# Patient Record
Sex: Male | Born: 1945 | Race: White | Hispanic: No | Smoking: Former smoker
Health system: Southern US, Community
[De-identification: ages and names within clinical notes are randomized; demographics above are authoritative.]

## PROBLEM LIST (undated history)

## (undated) DIAGNOSIS — Z8709 Personal history of other diseases of the respiratory system: Secondary | ICD-10-CM

## (undated) DIAGNOSIS — Z9989 Dependence on other enabling machines and devices: Secondary | ICD-10-CM

## (undated) DIAGNOSIS — F32A Depression, unspecified: Secondary | ICD-10-CM

## (undated) DIAGNOSIS — E7800 Pure hypercholesterolemia, unspecified: Secondary | ICD-10-CM

## (undated) DIAGNOSIS — F329 Major depressive disorder, single episode, unspecified: Secondary | ICD-10-CM

## (undated) DIAGNOSIS — H9191 Unspecified hearing loss, right ear: Secondary | ICD-10-CM

## (undated) DIAGNOSIS — M1990 Unspecified osteoarthritis, unspecified site: Secondary | ICD-10-CM

## (undated) DIAGNOSIS — G4733 Obstructive sleep apnea (adult) (pediatric): Secondary | ICD-10-CM

## (undated) DIAGNOSIS — C801 Malignant (primary) neoplasm, unspecified: Secondary | ICD-10-CM

## (undated) DIAGNOSIS — K219 Gastro-esophageal reflux disease without esophagitis: Secondary | ICD-10-CM

## (undated) DIAGNOSIS — I10 Essential (primary) hypertension: Secondary | ICD-10-CM

## (undated) DIAGNOSIS — F419 Anxiety disorder, unspecified: Secondary | ICD-10-CM

## (undated) HISTORY — PX: CARPAL TUNNEL RELEASE: SHX101

---

## 2011-12-13 DIAGNOSIS — E291 Testicular hypofunction: Secondary | ICD-10-CM | POA: Diagnosis not present

## 2011-12-22 DIAGNOSIS — Z6827 Body mass index (BMI) 27.0-27.9, adult: Secondary | ICD-10-CM | POA: Diagnosis not present

## 2011-12-22 DIAGNOSIS — F411 Generalized anxiety disorder: Secondary | ICD-10-CM | POA: Diagnosis not present

## 2011-12-22 DIAGNOSIS — M1990 Unspecified osteoarthritis, unspecified site: Secondary | ICD-10-CM | POA: Diagnosis not present

## 2011-12-27 DIAGNOSIS — E291 Testicular hypofunction: Secondary | ICD-10-CM | POA: Diagnosis not present

## 2011-12-29 DIAGNOSIS — G5600 Carpal tunnel syndrome, unspecified upper limb: Secondary | ICD-10-CM | POA: Diagnosis not present

## 2011-12-29 DIAGNOSIS — Z9889 Other specified postprocedural states: Secondary | ICD-10-CM | POA: Diagnosis not present

## 2012-01-10 DIAGNOSIS — E291 Testicular hypofunction: Secondary | ICD-10-CM | POA: Diagnosis not present

## 2012-01-24 DIAGNOSIS — E291 Testicular hypofunction: Secondary | ICD-10-CM | POA: Diagnosis not present

## 2012-02-01 DIAGNOSIS — I10 Essential (primary) hypertension: Secondary | ICD-10-CM | POA: Diagnosis not present

## 2012-02-01 DIAGNOSIS — Z79899 Other long term (current) drug therapy: Secondary | ICD-10-CM | POA: Diagnosis not present

## 2012-02-01 DIAGNOSIS — E291 Testicular hypofunction: Secondary | ICD-10-CM | POA: Diagnosis not present

## 2012-02-01 DIAGNOSIS — E785 Hyperlipidemia, unspecified: Secondary | ICD-10-CM | POA: Diagnosis not present

## 2012-02-01 DIAGNOSIS — Z6827 Body mass index (BMI) 27.0-27.9, adult: Secondary | ICD-10-CM | POA: Diagnosis not present

## 2012-02-21 DIAGNOSIS — E291 Testicular hypofunction: Secondary | ICD-10-CM | POA: Diagnosis not present

## 2012-03-14 DIAGNOSIS — M1710 Unilateral primary osteoarthritis, unspecified knee: Secondary | ICD-10-CM | POA: Diagnosis not present

## 2012-03-14 DIAGNOSIS — Z6827 Body mass index (BMI) 27.0-27.9, adult: Secondary | ICD-10-CM | POA: Diagnosis not present

## 2012-03-14 DIAGNOSIS — M19019 Primary osteoarthritis, unspecified shoulder: Secondary | ICD-10-CM | POA: Diagnosis not present

## 2012-03-23 DIAGNOSIS — E291 Testicular hypofunction: Secondary | ICD-10-CM | POA: Diagnosis not present

## 2012-04-03 DIAGNOSIS — M1710 Unilateral primary osteoarthritis, unspecified knee: Secondary | ICD-10-CM | POA: Diagnosis not present

## 2012-04-03 DIAGNOSIS — M25519 Pain in unspecified shoulder: Secondary | ICD-10-CM | POA: Diagnosis not present

## 2012-04-04 ENCOUNTER — Encounter (HOSPITAL_COMMUNITY): Payer: Self-pay | Admitting: Pharmacy Technician

## 2012-04-07 ENCOUNTER — Encounter (HOSPITAL_COMMUNITY): Payer: Self-pay

## 2012-04-07 ENCOUNTER — Encounter (HOSPITAL_COMMUNITY)
Admission: RE | Admit: 2012-04-07 | Discharge: 2012-04-07 | Disposition: A | Discharge status: Home / Self Care | Payer: Medicare Other | Source: Ambulatory Visit | Attending: Orthopedic Surgery | Admitting: Orthopedic Surgery

## 2012-04-07 ENCOUNTER — Ambulatory Visit (HOSPITAL_COMMUNITY)
Admission: RE | Admit: 2012-04-07 | Discharge: 2012-04-07 | Disposition: A | Discharge status: Home / Self Care | Payer: Medicare Other | Source: Ambulatory Visit | Attending: Anesthesiology | Admitting: Anesthesiology

## 2012-04-07 DIAGNOSIS — Z01812 Encounter for preprocedural laboratory examination: Secondary | ICD-10-CM | POA: Insufficient documentation

## 2012-04-07 DIAGNOSIS — Z01811 Encounter for preprocedural respiratory examination: Secondary | ICD-10-CM | POA: Diagnosis not present

## 2012-04-07 DIAGNOSIS — Z01818 Encounter for other preprocedural examination: Secondary | ICD-10-CM | POA: Diagnosis not present

## 2012-04-07 DIAGNOSIS — Z01810 Encounter for preprocedural cardiovascular examination: Secondary | ICD-10-CM | POA: Insufficient documentation

## 2012-04-07 HISTORY — DX: Essential (primary) hypertension: I10

## 2012-04-07 HISTORY — DX: Unspecified osteoarthritis, unspecified site: M19.90

## 2012-04-07 HISTORY — DX: Major depressive disorder, single episode, unspecified: F32.9

## 2012-04-07 HISTORY — DX: Depression, unspecified: F32.A

## 2012-04-07 HISTORY — DX: Anxiety disorder, unspecified: F41.9

## 2012-04-07 LAB — SURGICAL PCR SCREEN
MRSA, PCR: NEGATIVE
Staphylococcus aureus: NEGATIVE

## 2012-04-07 LAB — CBC
HCT: 48.6 % (ref 39.0–52.0)
MCHC: 34.4 g/dL (ref 30.0–36.0)
MCV: 94.2 fL (ref 78.0–100.0)
RDW: 13.4 % (ref 11.5–15.5)
WBC: 11 10*3/uL — ABNORMAL HIGH (ref 4.0–10.5)

## 2012-04-07 LAB — TYPE AND SCREEN: ABO/RH(D): A POS

## 2012-04-07 LAB — BASIC METABOLIC PANEL
BUN: 25 mg/dL — ABNORMAL HIGH (ref 6–23)
CO2: 27 mEq/L (ref 19–32)
Chloride: 106 mEq/L (ref 96–112)
Creatinine, Ser: 0.97 mg/dL (ref 0.50–1.35)

## 2012-04-07 NOTE — Pre-Procedure Instructions (Signed)
20 Nathaniel Lee  04/07/2012   Your procedure is scheduled on:  04/13/2012  Report to [facility] at 8:00 AM.  Call this number if you have problems the morning of surgery: [phone]   Remember:   Do not eat food:After Midnight.  [facility]  May have clear liquids: up to 4 Hours before arrival.  NOTHING TO DRINK after 4:00a.m.   Clear liquids include soda, tea, black coffee, apple or grape juice, broth.  Take these medicines the morning of surgery with A SIP OF WATER:  PAXIL, KLONOPIN   Do not wear jewelry, make-up or nail polish.  Do not wear lotions, powders, or perfumes. You may wear deodorant.  Do not shave 48 hours prior to surgery.  Do not bring valuables to the hospital.  Contacts, dentures or bridgework may not be worn into surgery.  Leave suitcase in the car. After surgery it may be brought to your room.  For patients admitted to the hospital, checkout time is 11:00 AM the day of discharge.   Patients discharged the day of surgery will not be allowed to drive home.  Name and phone number of your driver: with  Nathaniel Lee  Special Instructions: CHG Shower Use Special Wash: 1/2 bottle night before surgery and 1/2 bottle morning of surgery.   Please read over the following fact sheets that you were given: Pain Booklet, Coughing and Deep Breathing, Blood Transfusion Information, MRSA Information and Surgical Site Infection Prevention

## 2012-04-12 MED ORDER — CEFAZOLIN SODIUM-DEXTROSE 2-3 GM-% IV SOLR
2.0000 g | INTRAVENOUS | Status: AC
  Administered 2012-04-13: 2 g via INTRAVENOUS
  Filled 2012-04-12: qty 50

## 2012-04-13 ENCOUNTER — Encounter (HOSPITAL_COMMUNITY): Payer: Self-pay | Admitting: General Practice

## 2012-04-13 ENCOUNTER — Encounter (HOSPITAL_COMMUNITY)
Admission: RE | Disposition: A | Discharge status: Home / Self Care | Payer: Self-pay | Source: Ambulatory Visit | Attending: Orthopedic Surgery

## 2012-04-13 ENCOUNTER — Encounter (HOSPITAL_COMMUNITY): Payer: Self-pay | Admitting: Anesthesiology

## 2012-04-13 ENCOUNTER — Ambulatory Visit (HOSPITAL_COMMUNITY): Payer: Medicare Other | Admitting: Anesthesiology

## 2012-04-13 ENCOUNTER — Inpatient Hospital Stay (HOSPITAL_COMMUNITY)
Admission: RE | Admit: 2012-04-13 | Discharge: 2012-04-14 | DRG: 484 | Disposition: A | Discharge status: Home / Self Care | Payer: Medicare Other | Source: Ambulatory Visit | Attending: Orthopedic Surgery | Admitting: Orthopedic Surgery

## 2012-04-13 DIAGNOSIS — F3289 Other specified depressive episodes: Secondary | ICD-10-CM | POA: Diagnosis not present

## 2012-04-13 DIAGNOSIS — F329 Major depressive disorder, single episode, unspecified: Secondary | ICD-10-CM | POA: Diagnosis present

## 2012-04-13 DIAGNOSIS — F411 Generalized anxiety disorder: Secondary | ICD-10-CM | POA: Diagnosis present

## 2012-04-13 DIAGNOSIS — Z7982 Long term (current) use of aspirin: Secondary | ICD-10-CM | POA: Diagnosis not present

## 2012-04-13 DIAGNOSIS — M19019 Primary osteoarthritis, unspecified shoulder: Principal | ICD-10-CM | POA: Diagnosis present

## 2012-04-13 DIAGNOSIS — M129 Arthropathy, unspecified: Secondary | ICD-10-CM | POA: Diagnosis present

## 2012-04-13 DIAGNOSIS — Z79899 Other long term (current) drug therapy: Secondary | ICD-10-CM

## 2012-04-13 DIAGNOSIS — M25519 Pain in unspecified shoulder: Secondary | ICD-10-CM | POA: Diagnosis not present

## 2012-04-13 DIAGNOSIS — I10 Essential (primary) hypertension: Secondary | ICD-10-CM | POA: Diagnosis present

## 2012-04-13 DIAGNOSIS — G8918 Other acute postprocedural pain: Secondary | ICD-10-CM | POA: Diagnosis not present

## 2012-04-13 HISTORY — PX: TOTAL SHOULDER ARTHROPLASTY: SHX126

## 2012-04-13 SURGERY — ARTHROPLASTY, SHOULDER, TOTAL
Anesthesia: General | Site: Shoulder | Laterality: Right | Wound class: Clean

## 2012-04-13 MED ORDER — TAMSULOSIN HCL 0.4 MG PO CAPS
0.4000 mg | ORAL_CAPSULE | Freq: Every day | ORAL | Status: DC
  Administered 2012-04-13: 0.4 mg via ORAL
  Filled 2012-04-13 (×2): qty 1

## 2012-04-13 MED ORDER — FENTANYL CITRATE 0.05 MG/ML IJ SOLN
INTRAMUSCULAR | Status: DC | PRN
  Administered 2012-04-13: 100 ug via INTRAVENOUS

## 2012-04-13 MED ORDER — KETOROLAC TROMETHAMINE 15 MG/ML IJ SOLN
15.0000 mg | Freq: Four times a day (QID) | INTRAMUSCULAR | Status: DC
  Administered 2012-04-13 – 2012-04-14 (×4): 15 mg via INTRAVENOUS
  Filled 2012-04-13 (×7): qty 1

## 2012-04-13 MED ORDER — ONDANSETRON HCL 4 MG/2ML IJ SOLN: 4.0000 mg | Freq: Four times a day (QID) | INTRAMUSCULAR | Status: DC | PRN

## 2012-04-13 MED ORDER — MIDAZOLAM HCL 2 MG/2ML IJ SOLN
INTRAMUSCULAR | Status: AC
  Filled 2012-04-13: qty 2

## 2012-04-13 MED ORDER — ONDANSETRON HCL 4 MG/2ML IJ SOLN
INTRAMUSCULAR | Status: DC | PRN
  Administered 2012-04-13: 4 mg via INTRAVENOUS

## 2012-04-13 MED ORDER — LACTATED RINGERS IV SOLN
INTRAVENOUS | Status: DC | PRN
  Administered 2012-04-13 (×2): via INTRAVENOUS

## 2012-04-13 MED ORDER — NEOSTIGMINE METHYLSULFATE 1 MG/ML IJ SOLN
INTRAMUSCULAR | Status: DC | PRN
  Administered 2012-04-13: 2 mg via INTRAVENOUS

## 2012-04-13 MED ORDER — DEXTROSE 5 % IV SOLN
500.0000 mg | Freq: Four times a day (QID) | INTRAVENOUS | Status: DC | PRN
  Filled 2012-04-13: qty 5

## 2012-04-13 MED ORDER — ACETAMINOPHEN 325 MG PO TABS
650.0000 mg | ORAL_TABLET | Freq: Four times a day (QID) | ORAL | Status: DC | PRN
  Filled 2012-04-13: qty 2

## 2012-04-13 MED ORDER — CEFAZOLIN SODIUM 1-5 GM-% IV SOLN
1.0000 g | Freq: Four times a day (QID) | INTRAVENOUS | Status: AC
  Administered 2012-04-13 – 2012-04-14 (×3): 1 g via INTRAVENOUS
  Filled 2012-04-13 (×4): qty 50

## 2012-04-13 MED ORDER — FENTANYL CITRATE 0.05 MG/ML IJ SOLN
INTRAMUSCULAR | Status: AC
  Filled 2012-04-13: qty 2

## 2012-04-13 MED ORDER — METOCLOPRAMIDE HCL 10 MG PO TABS
5.0000 mg | ORAL_TABLET | Freq: Three times a day (TID) | ORAL | Status: DC | PRN
Start: 2012-04-13 — End: 2012-04-14

## 2012-04-13 MED ORDER — KETOROLAC TROMETHAMINE 30 MG/ML IJ SOLN
INTRAMUSCULAR | Status: AC
  Filled 2012-04-13: qty 1

## 2012-04-13 MED ORDER — MIDAZOLAM HCL 5 MG/5ML IJ SOLN
INTRAMUSCULAR | Status: DC | PRN
  Administered 2012-04-13: 1 mg via INTRAVENOUS

## 2012-04-13 MED ORDER — SODIUM CHLORIDE 0.9 % IR SOLN
Status: DC | PRN
  Administered 2012-04-13: 1000 mL

## 2012-04-13 MED ORDER — PROPOFOL 10 MG/ML IV EMUL
INTRAVENOUS | Status: DC | PRN
  Administered 2012-04-13: 200 mg via INTRAVENOUS

## 2012-04-13 MED ORDER — LISINOPRIL 40 MG PO TABS
40.0000 mg | ORAL_TABLET | Freq: Every day | ORAL | Status: DC
  Administered 2012-04-13: 40 mg via ORAL
  Filled 2012-04-13 (×2): qty 1

## 2012-04-13 MED ORDER — ONDANSETRON HCL 4 MG PO TABS: 4.0000 mg | ORAL_TABLET | Freq: Four times a day (QID) | ORAL | Status: DC | PRN

## 2012-04-13 MED ORDER — FENTANYL CITRATE 0.05 MG/ML IJ SOLN
50.0000 ug | INTRAMUSCULAR | Status: DC | PRN
  Administered 2012-04-13: 100 ug via INTRAVENOUS

## 2012-04-13 MED ORDER — METHOCARBAMOL 100 MG/ML IJ SOLN
500.0000 mg | INTRAVENOUS | Status: AC
  Administered 2012-04-13: 500 mg via INTRAVENOUS
  Filled 2012-04-13: qty 5

## 2012-04-13 MED ORDER — PHENYLEPHRINE HCL 10 MG/ML IJ SOLN
INTRAMUSCULAR | Status: DC | PRN
  Administered 2012-04-13 (×2): 40 ug via INTRAVENOUS
  Administered 2012-04-13 (×3): 80 ug via INTRAVENOUS
  Administered 2012-04-13 (×2): 40 ug via INTRAVENOUS
  Administered 2012-04-13: 80 ug via INTRAVENOUS
  Administered 2012-04-13: 40 ug via INTRAVENOUS
  Administered 2012-04-13 (×2): 80 ug via INTRAVENOUS
  Administered 2012-04-13: 40 ug via INTRAVENOUS

## 2012-04-13 MED ORDER — METOCLOPRAMIDE HCL 5 MG/ML IJ SOLN: 5.0000 mg | Freq: Three times a day (TID) | INTRAMUSCULAR | Status: DC | PRN

## 2012-04-13 MED ORDER — GEMFIBROZIL 600 MG PO TABS
600.0000 mg | ORAL_TABLET | Freq: Every day | ORAL | Status: DC
  Administered 2012-04-13 – 2012-04-14 (×2): 600 mg via ORAL
  Filled 2012-04-13 (×2): qty 1

## 2012-04-13 MED ORDER — ZOLPIDEM TARTRATE 5 MG PO TABS: 5.0000 mg | ORAL_TABLET | Freq: Every evening | ORAL | Status: DC | PRN

## 2012-04-13 MED ORDER — LACTATED RINGERS IV SOLN
INTRAVENOUS | Status: DC
  Administered 2012-04-13: 10:00:00 via INTRAVENOUS

## 2012-04-13 MED ORDER — PAROXETINE HCL 20 MG PO TABS
20.0000 mg | ORAL_TABLET | Freq: Every morning | ORAL | Status: DC
  Administered 2012-04-14: 20 mg via ORAL
  Filled 2012-04-13: qty 1

## 2012-04-13 MED ORDER — LACTATED RINGERS IV SOLN
INTRAVENOUS | Status: DC
  Administered 2012-04-13: 75 mL/h via INTRAVENOUS

## 2012-04-13 MED ORDER — MENTHOL 3 MG MT LOZG
1.0000 | LOZENGE | OROMUCOSAL | Status: DC | PRN
  Filled 2012-04-13: qty 9

## 2012-04-13 MED ORDER — ACETAMINOPHEN 650 MG RE SUPP: 650.0000 mg | Freq: Four times a day (QID) | RECTAL | Status: DC | PRN

## 2012-04-13 MED ORDER — CHLORHEXIDINE GLUCONATE 4 % EX LIQD: 60.0000 mL | Freq: Once | CUTANEOUS | Status: DC

## 2012-04-13 MED ORDER — TRAZODONE HCL 50 MG PO TABS
50.0000 mg | ORAL_TABLET | Freq: Every day | ORAL | Status: DC
  Administered 2012-04-13: 50 mg via ORAL
  Filled 2012-04-13 (×2): qty 1

## 2012-04-13 MED ORDER — OXYCODONE-ACETAMINOPHEN 5-325 MG PO TABS
1.0000 | ORAL_TABLET | ORAL | Status: DC | PRN
  Administered 2012-04-13 – 2012-04-14 (×3): 2 via ORAL
  Filled 2012-04-13 (×3): qty 2

## 2012-04-13 MED ORDER — HYDROMORPHONE HCL PF 1 MG/ML IJ SOLN
0.2500 mg | INTRAMUSCULAR | Status: DC | PRN
  Administered 2012-04-13 (×2): 0.5 mg via INTRAVENOUS

## 2012-04-13 MED ORDER — MIDAZOLAM HCL 2 MG/2ML IJ SOLN
1.0000 mg | INTRAMUSCULAR | Status: DC | PRN
  Administered 2012-04-13: 2 mg via INTRAVENOUS

## 2012-04-13 MED ORDER — PHENOL 1.4 % MT LIQD
1.0000 | OROMUCOSAL | Status: DC | PRN
  Filled 2012-04-13: qty 177

## 2012-04-13 MED ORDER — HYDROMORPHONE HCL PF 1 MG/ML IJ SOLN: 0.5000 mg | INTRAMUSCULAR | Status: DC | PRN

## 2012-04-13 MED ORDER — METHOCARBAMOL 500 MG PO TABS
500.0000 mg | ORAL_TABLET | Freq: Four times a day (QID) | ORAL | Status: DC | PRN
  Administered 2012-04-13: 500 mg via ORAL
  Filled 2012-04-13: qty 1

## 2012-04-13 MED ORDER — DIPHENHYDRAMINE HCL 12.5 MG/5ML PO ELIX: 12.5000 mg | ORAL_SOLUTION | ORAL | Status: DC | PRN

## 2012-04-13 MED ORDER — CLONAZEPAM 0.5 MG PO TABS
0.5000 mg | ORAL_TABLET | Freq: Two times a day (BID) | ORAL | Status: DC | PRN
  Administered 2012-04-13: 0.5 mg via ORAL
  Filled 2012-04-13: qty 1

## 2012-04-13 MED ORDER — EPHEDRINE SULFATE 50 MG/ML IJ SOLN
INTRAMUSCULAR | Status: DC | PRN
  Administered 2012-04-13 (×2): 5 mg via INTRAVENOUS
  Administered 2012-04-13: 10 mg via INTRAVENOUS
  Administered 2012-04-13 (×4): 5 mg via INTRAVENOUS

## 2012-04-13 MED ORDER — ROCURONIUM BROMIDE 100 MG/10ML IV SOLN
INTRAVENOUS | Status: DC | PRN
  Administered 2012-04-13: 50 mg via INTRAVENOUS

## 2012-04-13 MED ORDER — DROPERIDOL 2.5 MG/ML IJ SOLN: 0.6250 mg | INTRAMUSCULAR | Status: DC | PRN

## 2012-04-13 MED ORDER — BUPIVACAINE-EPINEPHRINE PF 0.5-1:200000 % IJ SOLN
INTRAMUSCULAR | Status: DC | PRN
  Administered 2012-04-13: 150 mg

## 2012-04-13 MED ORDER — GLYCOPYRROLATE 0.2 MG/ML IJ SOLN
INTRAMUSCULAR | Status: DC | PRN
  Administered 2012-04-13: 0.2 mg via INTRAVENOUS

## 2012-04-13 SURGICAL SUPPLY — 67 items
BLADE SAW SGTL 83.5X18.5 (BLADE) ×2 IMPLANT
CEMENT BONE DEPUY (Cement) ×2 IMPLANT
CEMENT HV SMART SET (Cement) ×2 IMPLANT
CLOTH BEACON ORANGE TIMEOUT ST (SAFETY) ×2 IMPLANT
CLSR STERI-STRIP ANTIMIC 1/2X4 (GAUZE/BANDAGES/DRESSINGS) ×2 IMPLANT
COVER SURGICAL LIGHT HANDLE (MISCELLANEOUS) ×2 IMPLANT
DRAPE INCISE IOBAN 66X45 STRL (DRAPES) ×2 IMPLANT
DRAPE SURG 17X11 SM STRL (DRAPES) ×2 IMPLANT
DRAPE SURG 17X23 STRL (DRAPES) ×2 IMPLANT
DRAPE U-SHAPE 47X51 STRL (DRAPES) ×2 IMPLANT
DRILL BIT 7/64X5 (BIT) IMPLANT
DRSG MEPILEX BORDER 4X8 (GAUZE/BANDAGES/DRESSINGS) ×2 IMPLANT
DURAPREP 26ML APPLICATOR (WOUND CARE) ×2 IMPLANT
ELECT BLADE 4.0 EZ CLEAN MEGAD (MISCELLANEOUS) ×2
ELECT CAUTERY BLADE 6.4 (BLADE) ×2 IMPLANT
ELECT REM PT RETURN 9FT ADLT (ELECTROSURGICAL) ×2
ELECTRODE BLDE 4.0 EZ CLN MEGD (MISCELLANEOUS) ×1 IMPLANT
ELECTRODE REM PT RTRN 9FT ADLT (ELECTROSURGICAL) ×1 IMPLANT
FACESHIELD LNG OPTICON STERILE (SAFETY) ×6 IMPLANT
GLOVE BIO SURGEON STRL SZ7.5 (GLOVE) ×2 IMPLANT
GLOVE BIO SURGEON STRL SZ8 (GLOVE) ×2 IMPLANT
GLOVE BIO SURGEON STRL SZ8.5 (GLOVE) ×2 IMPLANT
GLOVE BIOGEL PI IND STRL 6.5 (GLOVE) ×1 IMPLANT
GLOVE BIOGEL PI INDICATOR 6.5 (GLOVE) ×1
GLOVE EUDERMIC 7 POWDERFREE (GLOVE) ×2 IMPLANT
GLOVE SS BIOGEL STRL SZ 6.5 (GLOVE) ×1 IMPLANT
GLOVE SS BIOGEL STRL SZ 7.5 (GLOVE) ×2 IMPLANT
GLOVE SUPERSENSE BIOGEL SZ 6.5 (GLOVE) ×1
GLOVE SUPERSENSE BIOGEL SZ 7.5 (GLOVE) ×2
GLOVE SURG SS PI 8.5 STRL IVOR (GLOVE) ×1
GLOVE SURG SS PI 8.5 STRL STRW (GLOVE) ×1 IMPLANT
GOWN PREVENTION PLUS XXLARGE (GOWN DISPOSABLE) ×2 IMPLANT
GOWN STRL NON-REIN LRG LVL3 (GOWN DISPOSABLE) ×2 IMPLANT
GOWN STRL REIN XL XLG (GOWN DISPOSABLE) ×4 IMPLANT
KIT BASIN OR (CUSTOM PROCEDURE TRAY) ×2 IMPLANT
KIT ROOM TURNOVER OR (KITS) ×2 IMPLANT
MANIFOLD NEPTUNE II (INSTRUMENTS) ×2 IMPLANT
NDL SUT 6 .5 CRC .975X.05 MAYO (NEEDLE) ×1 IMPLANT
NEEDLE HYPO 25GX1X1/2 BEV (NEEDLE) IMPLANT
NEEDLE MAYO TAPER (NEEDLE) ×1
NS IRRIG 1000ML POUR BTL (IV SOLUTION) ×2 IMPLANT
PACK SHOULDER (CUSTOM PROCEDURE TRAY) ×2 IMPLANT
PAD ARMBOARD 7.5X6 YLW CONV (MISCELLANEOUS) ×4 IMPLANT
PASSER SUT SWANSON 36MM LOOP (INSTRUMENTS) IMPLANT
SLING ARM FOAM STRAP LRG (SOFTGOODS) ×2 IMPLANT
SLING ARM IMMOBILIZER LRG (SOFTGOODS) IMPLANT
SMARTMIX MINI TOWER (MISCELLANEOUS) ×2
SMARTSET HV BONE CEMENT IMPLANT
SPONGE LAP 18X18 X RAY DECT (DISPOSABLE) ×2 IMPLANT
SPONGE LAP 4X18 X RAY DECT (DISPOSABLE) ×2 IMPLANT
STRIP CLOSURE SKIN 1/2X4 (GAUZE/BANDAGES/DRESSINGS) ×2 IMPLANT
SUCTION FRAZIER TIP 10 FR DISP (SUCTIONS) ×2 IMPLANT
SUT BONE WAX W31G (SUTURE) IMPLANT
SUT FIBERWIRE #2 38 T-5 BLUE (SUTURE) ×6
SUT MNCRL AB 3-0 PS2 18 (SUTURE) ×2 IMPLANT
SUT VIC AB 1 CT1 27 (SUTURE) ×1
SUT VIC AB 1 CT1 27XBRD ANBCTR (SUTURE) ×1 IMPLANT
SUT VIC AB 2-0 CT1 27 (SUTURE) ×1
SUT VIC AB 2-0 CT1 TAPERPNT 27 (SUTURE) ×1 IMPLANT
SUTURE FIBERWR #2 38 T-5 BLUE (SUTURE) ×3 IMPLANT
SYR 30ML SLIP (SYRINGE) IMPLANT
SYR CONTROL 10ML LL (SYRINGE) IMPLANT
TOWEL OR 17X24 6PK STRL BLUE (TOWEL DISPOSABLE) ×2 IMPLANT
TOWEL OR 17X26 10 PK STRL BLUE (TOWEL DISPOSABLE) ×2 IMPLANT
TOWER SMARTMIX MINI (MISCELLANEOUS) ×1 IMPLANT
WATER STERILE IRR 1000ML POUR (IV SOLUTION) ×2 IMPLANT
YANKAUER SUCT BULB TIP NO VENT (SUCTIONS) ×2 IMPLANT

## 2012-04-13 NOTE — Op Note (Signed)
04/13/2012  12:21 PM  PATIENT:   Nathaniel Lee  66 y.o. male  PRE-OPERATIVE DIAGNOSIS:  osteoarithritis right shoulder  POST-OPERATIVE DIAGNOSIS:  same  PROCEDURE:  Right TSA #16 stem, 48x21 eccentric HH, 44 glenoid  SURGEON:  Blane Worthington, Vania Rea. M.D.  ASSISTANTS: Shuford pac   ANESTHESIA:   GET + ISB  EBL: 350  SPECIMEN:  none  Drains: none   PATIENT DISPOSITION:  PACU - hemodynamically stable.    PLAN OF CARE: Admit to inpatient   Dictation# [id number]

## 2012-04-13 NOTE — Transfer of Care (Signed)
Immediate Anesthesia Transfer of Care Note  Patient: Nathaniel Lee  Procedure(s) Performed: Procedure(s) (LRB): TOTAL SHOULDER ARTHROPLASTY (Right)  Patient Location: PACU  Anesthesia Type: General  Level of Consciousness: awake, alert  and oriented  Airway & Oxygen Therapy: Patient Spontanous Breathing and Patient connected to nasal cannula oxygen  Post-op Assessment: Report given to PACU RN, Post -op Vital signs reviewed and stable and Patient moving all extremities X 4  Post vital signs: Reviewed and stable  Complications: No apparent anesthesia complications

## 2012-04-13 NOTE — Anesthesia Procedure Notes (Addendum)
Anesthesia Regional Block:  Interscalene brachial plexus block  Pre-Anesthetic Checklist: ,, timeout performed, Correct Patient, Correct Site, Correct Laterality, Correct Procedure,, site marked, risks and benefits discussed, Surgical consent,  Pre-op evaluation,  At surgeon's request and post-op pain management  Laterality: Right  Prep: chloraprep       Needles:  Injection technique: Single-shot  Needle Type: Echogenic Stimulator Needle     Needle Length: 5cm 5 cm Needle Gauge: 22 and 22 G    Additional Needles:  Procedures: ultrasound guided and nerve stimulator Interscalene brachial plexus block  Nerve Stimulator or Paresthesia:  Response: bicep contraction, 0.45 mA,   Additional Responses:   Narrative:  Start time: 04/13/2012 9:17 AM End time: 04/13/2012 9:29 AM Injection made incrementally with aspirations every 5 mL.  Performed by: Personally  Anesthesiologist: J. Adonis Huguenin, MD  Additional Notes: Functioning IV was confirmed and monitors applied.  A 50mm 22ga echogenic arrow stimulator was used. Sterile prep and drape,hand hygiene and sterile gloves were used.Ultrasound guidance: relevant anatomy identified, needle position confirmed, local anesthetic spread visualized around nerve(s)., vascular puncture avoided.  Image printed for medical record.  Negative aspiration and negative test dose prior to incremental administration of local anesthetic. The patient tolerated the procedure well.  Interscalene brachial plexus block Procedure Name: Intubation Date/Time: 04/13/2012 10:08 AM Performed by: Sharlene Dory E Pre-anesthesia Checklist: Patient identified, Emergency Drugs available, Suction available, Patient being monitored and Timeout performed Patient Re-evaluated:Patient Re-evaluated prior to inductionOxygen Delivery Method: Circle system utilized Preoxygenation: Pre-oxygenation with 100% oxygen Intubation Type: IV induction Ventilation: Mask ventilation without  difficulty Laryngoscope Size: Mac and 3 Grade View: Grade I Tube type: Oral Tube size: 7.5 mm Number of attempts: 1 Airway Equipment and Method: Stylet Placement Confirmation: ETT inserted through vocal cords under direct vision,  positive ETCO2 and breath sounds checked- equal and bilateral Secured at: 21 cm Tube secured with: Tape Dental Injury: Teeth and Oropharynx as per pre-operative assessment

## 2012-04-13 NOTE — Anesthesia Preprocedure Evaluation (Addendum)
Anesthesia Evaluation  Patient identified by MRN, date of birth, ID band Patient awake    Reviewed: Allergy & Precautions  Airway Mallampati: II TM Distance: >3 FB Neck ROM: Full    Dental  (+) Teeth Intact   Pulmonary Current Smoker,  breath sounds clear to auscultation  Pulmonary exam normal       Cardiovascular hypertension, Pt. on medications Rhythm:Regular Rate:Normal     Neuro/Psych PSYCHIATRIC DISORDERS Anxiety Depression negative neurological ROS     GI/Hepatic negative GI ROS, Neg liver ROS,   Endo/Other  negative endocrine ROS  Renal/GU negative Renal ROS     Musculoskeletal   Abdominal   Peds  Hematology   Anesthesia Other Findings   Reproductive/Obstetrics                           Anesthesia Physical Anesthesia Plan  ASA: II  Anesthesia Plan: General   Post-op Pain Management:    Induction: Intravenous  Airway Management Planned: Oral ETT  Additional Equipment:   Intra-op Plan:   Post-operative Plan: Extubation in OR  Informed Consent: I have reviewed the patients History and Physical, chart, labs and discussed the procedure including the risks, benefits and alternatives for the proposed anesthesia with the patient or authorized representative who has indicated his/her understanding and acceptance.   Dental advisory given  Plan Discussed with: CRNA, Anesthesiologist and Surgeon  Anesthesia Plan Comments:         Anesthesia Quick Evaluation

## 2012-04-13 NOTE — H&P (Signed)
Nathaniel Lee    Chief Complaint: osteoarithritis right shoulder HPI: The patient is a 66 y.o. male with end stage right shoulder arthritis  Past Medical History  Diagnosis Date  . Arthritis     R shoulder, both knees  . Depression   . Anxiety     h/o panic attacks - related to anxiety   . Hypertension     ekg- last one done 2010 at Dr. Jearl Lee office- [place] , sees Dr. Tomasa Lee- Nathaniel Lee now for PCP, [facility]     Past Surgical History  Procedure Date  . Carpal tunnel release     L hand - poor results, per pt. - 2012    Family History  Problem Relation Age of Onset  . Anesthesia problems Neg Hx     Social History:  reports that he has been smoking.  He does not have any smokeless tobacco history on file. He reports that he does not drink alcohol or use illicit drugs.  Allergies: No Known Allergies  Medications Prior to Admission  Medication Sig Dispense Refill  . Ascorbic Acid (VITAMIN C) 1000 MG tablet Take 1,000 mg by mouth daily.      Marland Kitchen aspirin 81 MG tablet Take 81 mg by mouth daily.      . clonazePAM (KLONOPIN) 0.5 MG tablet Take 0.5 mg by mouth 2 (two) times daily as needed. For acute stress       . Cyanocobalamin (VITAMIN B 12 PO) Take 1 tablet by mouth daily.      Marland Kitchen gemfibrozil (LOPID) 600 MG tablet Take 600 mg by mouth daily.      Marland Kitchen lisinopril (PRINIVIL,ZESTRIL) 40 MG tablet Take 40 mg by mouth at bedtime.      . niacin 500 MG tablet Take 500 mg by mouth daily with breakfast.      . oxyCODONE-acetaminophen (PERCOCET) 10-325 MG per tablet Take 1 tablet by mouth 4 (four) times daily as needed. For pain      . PARoxetine (PAXIL) 20 MG tablet Take 20 mg by mouth every morning.       . Tamsulosin HCl (FLOMAX) 0.4 MG CAPS Take 0.4 mg by mouth at bedtime.      . traZODone (DESYREL) 50 MG tablet Take 50 mg by mouth at bedtime.         Physical Exam: right shoulder with painful and restricted ROM as documented at 04/03/12 office visit  Vitals  Temp:   [97.5 F (36.4 C)] 97.5 F (36.4 C) (05/16 0801) Pulse Rate:  [63] 63  (05/16 0801) Resp:  [18] 18  (05/16 0801) BP: (158)/(78) 158/78 mmHg (05/16 0801) SpO2:  [98 %] 98 % (05/16 0801)  Assessment/Plan  Impression: osteoarithritis right shoulder  Plan of Action: Procedure(s): TOTAL SHOULDER ARTHROPLASTY  Nathaniel Lee M 04/13/2012, 9:14 AM

## 2012-04-13 NOTE — Preoperative (Signed)
Beta Blockers   Reason not to administer Beta Blockers:Not Applicable 

## 2012-04-13 NOTE — Anesthesia Postprocedure Evaluation (Signed)
Anesthesia Post Note  Patient: Nathaniel Lee  Procedure(s) Performed: Procedure(s) (LRB): TOTAL SHOULDER ARTHROPLASTY (Right)  Anesthesia type: general  Patient location: PACU  Post pain: Pain level controlled  Post assessment: Patient's Cardiovascular Status Stable  Last Vitals:  Filed Vitals:   04/13/12 1315  BP: 131/75  Pulse: 87  Temp:   Resp: 21    Post vital signs: Reviewed and stable  Level of consciousness: sedated  Complications: No apparent anesthesia complications

## 2012-04-13 NOTE — Op Note (Signed)
NAME:  Nathaniel Lee, Nathaniel Lee                 ACCOUNT NO.:  [id number]  MEDICAL RECORD NO.:  [id number]  LOCATION:  5025                         FACILITY:  [facility]  PHYSICIAN:  Vania Rea. Shaine Newmark, M.D.  DATE OF BIRTH:  07/03/1946  DATE OF PROCEDURE:  04/13/2012 DATE OF DISCHARGE:                              OPERATIVE REPORT   POSTOPERATIVE DIAGNOSIS:  End-stage right shoulder osteoarthritis.  POSTOPERATIVE DIAGNOSIS:  End-stage right shoulder osteoarthritis.  PROCEDURE:  Right total shoulder arthroplasty, using press-fit size 16 DePuy global stem with a 48 x 21 eccentric humeral head and a cemented pegged 44 glenoid.  SURGEON:  Vania Rea. Ozetta Flatley, M.D.  Threasa HeadsFrench Ana A. Shuford, PA-C.  ANESTHESIA:  General endotracheal as well as an interscalene block.  ESTIMATED BLOOD LOSS:  250 mL.  DRAINS:  None.  HISTORY:  Nathaniel Lee is a 66 year old gentleman who has had chronic and progressive increasing right shoulder pain with severe restriction in mobility and functional limitations due to end-stage osteoarthritis.  He is brought to the operating room at this time for planned right total shoulder arthroplasty.  Preoperatively, I had counseled Nathaniel Lee on treatment options as well as risks versus benefits thereof.  Possible surgical complications were reviewed including potential for bleeding, infection, neurovascular injury, persistent pain, loss of motion, failure of the implant, anesthetic complication, possible need for additional surgery.  He understands and accepts and agrees to our planned procedure.  PROCEDURE IN DETAIL:  After undergoing routine preop evaluation, the patient received prophylactic antibiotics.  An interscalene block was established in holding area by the Anesthesia Department.  Placed supine on op table, underwent smooth induction of a general endotracheal anesthesia.  Placed into beach-chair position and appropriate padding protected.  Right shoulder girdle region  was then sterilely prepped and draped in standard fashion.  Time-out was called.  An anterior approach to right shoulder was made through a 15-cm deltopectoral incision, skin flaps were elevated and electrocautery used for hemostasis.  The cephalic vein was identified and retracted laterally with the deltoid, pec major taken medially and upper 2 cm tenotomized for enhanced visualization.  Conjoined tendon was then identified, mobilized, retracted medially and then dissection carried through the subacromial/subdeltoid bursa and self-retaining retractors were placed. The biceps tendon sheath was then incised, biceps tendon was tenotomized for later tenodesis.  We then divided the rotator cuff along the rotator interval to the base of the coracoid and then divided the subscapularis from the lesser tuberosity insertion with electrocautery leaving a lateral cuff of tissue approximately a centimeter and half and then tagged the free margin of the subscapularis for later repair with a series of grasping #2 FiberWire sutures.  We then divided the capsule anteriorly, inferiorly, and posteriorly.  There was a very large osteophytes in the inferior aspect of the humeral head, which were removed with rongeur.  I then performed soft tissue releases from the inferior aspect of the humeral head to allow delivery of the head through the wound and carefully protecting the rotator cuff superiorly and posteriorly.  There was noted to be severe deformity of the humeral head, consistent with the advanced arthrosis.  Using an extramedullary guide, we then  outlined a proposed humeral head resection, then completed this with an oscillating saw.  We then prepared the humeral canal with hand reaming up to size 16 and then broached up to size 16 maintaining approximately 30 degrees of retroversion.  At this point, we then placed a metal cap over the cut surface of the proximal humerus and exposed the glenoid with a  series of Fukuda, pitchfork, and snake tongue retractors.  The glenoid was markedly dysmorphic and severely rotated medially.  We gained circumferential exposure moving peripheral soft tissues and performed labral resection and capsular release circumferentially.  We then placed a guide pin in the center of the glenoid and performed an initial reaming with a size 48 reamer, but found that the glenoid became deficient both anteriorly and posteriorly and 44 provided the appropriate fit.  I did have to remove the guide pin slightly more anteriorly for the final reaming and again removed very little bone posteriorly, but I did correct some of the retroversion and ultimately had a solid bony bed.  A rongeur was then used to remove some of the residual peripheral bone.  The central drill hole was then made over the guidewire and guide was introduced for the peripheral PEG holes and the superior and posterior had excellent fit, but the anterior peg hole was decision.  At this point, the glenoid was irrigated and performed the trial implantation showing good bony purchase and good stability.  At this point, cement was mixed and introduced into the superior and posterior peg holes, and the final 44 pegged glenoid was implanted with excellent fixation and stability.  After cement was hardened, we then returned our attention to the proximal humerus and did the initial impaction of our final stem, but found that there was some asymmetry of the bone of the proximal humerus, which was not completely compensated for with our humeral head selection and so I removed the stem and revisited our humeral head resection, which I cut once again with an oscillating saw, removing some very prominent osteophytes that had developed superiorly and posteriorly.  Once this was completed, we then re-broached and we were able to harvest additional bone for bone grafting of the stem.  We then implanted the stem once again  and at this point, we were able to achieve appropriate coverage of the proximal humerus with the 48 x 21 humeral head.  We performed trial reductions with various heads and found good soft tissue balance with a 48 x 21 eccentric.  At this point, the trials were removed.  We cleaned the [facility] taper.  The final 48 x 21 eccentric head was impacted into position.  Final reduction was performed, which did confirm 50% translation of the humeral head and glenoid with good soft tissue balance and good stability.  The wound was then irrigated.  Hemostasis was obtained.  I took care to completely mobilize the subscapularis circumferentially and had good elasticity and then it was repaired back to the lesser tuberosity with #2 FiberWire's and then I closed the rotator interval laterally with 2 figure-of-eight #2 FiberWire sutures. Shoulder was taken through a range of motion showing good stability and good soft tissue balance.  The deltopectoral interval was then reapproximated with 0 Vicryl and 2-0 Vicryl used for the deep subcutaneous layer and intracuticular 3-0 Monocryl for the skin, followed by Steri-Strips.  Dry dressing was placed on the right shoulder and right arm was placed in a sling.  The patient was awakened, extubated, and taken  to recovery room in stable condition.  Ralene Bathe, PA-C, was utilized throughout this case as Product manager, essential for help with positioning of the extremity, soft tissue retraction, manipulation of the joint and implants, wound closure and intraoperative decision making.    Vania Rea. Marchelle Rinella, M.D.    KMS/MEDQ  D:  04/13/2012  T:  04/13/2012  Job:  [id number]

## 2012-04-14 ENCOUNTER — Encounter (HOSPITAL_COMMUNITY): Payer: Self-pay | Admitting: Orthopedic Surgery

## 2012-04-14 MED ORDER — METHOCARBAMOL 500 MG PO TABS: 500.0000 mg | ORAL_TABLET | Freq: Three times a day (TID) | ORAL | Status: AC | PRN

## 2012-04-14 MED ORDER — HYDROMORPHONE HCL 4 MG PO TABS: 4.0000 mg | ORAL_TABLET | ORAL | Status: AC | PRN

## 2012-04-14 NOTE — Progress Notes (Signed)
CARE MANAGEMENT NOTE 04/14/2012  Patient:  Nathaniel Lee, Nathaniel Lee   Account Number:  [id number]  Date Initiated:  04/14/2012  Documentation initiated by:  Vance Peper  Subjective/Objective Assessment:   66 yr old male s/p right shoulder arthroplasty     Action/Plan:   Patient has no home health needs. Has family support   Anticipated DC Date:  04/14/2012   Anticipated DC Plan:  HOME/SELF CARE                   Status of service:  Completed, signed off    Discharge Disposition:  HOME/SELF CARE

## 2012-04-14 NOTE — Progress Notes (Signed)
Pt D/C to home. Scripts and instructions given.

## 2012-04-14 NOTE — Discharge Instructions (Signed)
   Kevin M. Supple, M.D., F.A.A.O.S. Orthopaedic Surgery Specializing in Arthroscopic and Reconstructive Surgery of the Shoulder and Knee [phone] [address] - [place], [place] [address] - Fax [phone]   POST-OP TOTAL SHOULDER REPLACEMENT/SHOULDER HEMIARTHROPLASTY INSTRUCTIONS  1. Call the office at [phone] to schedule your first post-op appointment 10-14 days from the date of your surgery.  2. Leave the steri-strips in place over your incisions when performing dressing changes and showering. You may remove your dressings and begin showering on day 5 from surgery. You can expect drainage that is bloody or yellow in nature that should gradually decrease from day of surgery. Change your dressing daily until drainage is completely resolved, then you may feel free to go without a bandage. You can also expect significant bruising around your shoulder that will drift down your arm and into your chest wall. This is very normal and should resolve over several days.  3. Wear your sling/immobilizer at all times except to perform the exercises below or to occasionally let your arm dangle by your side to stretch your elbow. You also need to sleep in your sling immobilizer until instructed otherwise.  4. Range of motion to your elbow, wrist, and hand are encouraged 3-5 times daily. Exercise to your hand and fingers helps to reduce swelling you may experience.  5. Utilize ice to the shoulder 3-5 times minimum a day and additionally if you are experiencing pain.  6. Prescriptions for a pain medication and a muscle relaxant are provided for you. It is recommended that if you are experiencing pain that you pain medication alone is not controlling, add the muscle relaxant along with the pain medication which can give additional pain relief. The first 1-2 days is generally the most severe of your pain and then should gradually decrease. As your pain lessens it is recommended that you  decrease your use of the pain medications to an "as needed basis'" only and to always comply with the recommended dosages of the pain medications.  7. Pain medications can produce constipation along with their use. If you experience this, the use of an over the counter stool softener or laxative daily is recommended.   8. For most patients, if insurance allows, home health services to include therapy has been arranged.  9. For additional questions or concerns, please do not hesitate to call the office. If after hours there is an answering service to forward your concerns to the physician on call.  POST-OP EXERCISES  Pendulum Exercises  Perform pendulum exercises while standing and bending at the waist. Support your uninvolved arm on a table or chair and allow your operated arm to hang freely. Make sure to do these exercises passively - not using you shoulder muscles.  Repeat 20 times. Do 3 sessions per day.      

## 2012-04-14 NOTE — Discharge Summary (Signed)
  PATIENT ID:      Nathaniel Lee  MRN:     [id number] DOB/AGE:    [birth year] / 66 y.o.     DISCHARGE SUMMARY  ADMISSION DATE:    04/13/2012 DISCHARGE DATE:   04/14/2012   ADMISSION DIAGNOSIS: osteoarithritis right shoulder  (osteoarithritis right shoulder)  DISCHARGE DIAGNOSIS:  osteoarithritis right shoulder    ADDITIONAL DIAGNOSIS: Active Problems:  * No active hospital problems. *   Past Medical History  Diagnosis Date  . Arthritis     R shoulder, both knees  . Depression   . Anxiety     h/o panic attacks - related to anxiety   . Hypertension     ekg- last one done 2010 at Dr. Jearl Klinefelter office- [place] , sees Dr. Tomasa Blase- [place] now for PCP, [facility]     PROCEDURE: Procedure(s): TOTAL SHOULDER ARTHROPLASTY on 04/13/2012   HISTORY:See H&P in chart  HOSPITAL COURSE:  Nathaniel Lee is a 66 y.o. admitted on 04/13/2012 and found to have a diagnosis of osteoarithritis right shoulder.  After appropriate laboratory studies were obtained  they were taken to the operating room on 04/13/2012 and underwent Procedure(s): TOTAL SHOULDER ARTHROPLASTY.   They were given perioperative antibiotics:  Anti-infectives     Start     Dose/Rate Route Frequency Ordered Stop   04/13/12 1600   ceFAZolin (ANCEF) IVPB 1 g/50 mL premix        1 g 100 mL/hr over 30 Minutes Intravenous Every 6 hours 04/13/12 1458 04/14/12 0959   04/12/12 1447   ceFAZolin (ANCEF) IVPB 2 g/50 mL premix        2 g 100 mL/hr over 30 Minutes Intravenous 60 min pre-op 04/12/12 1447 04/13/12 1015        .   Pt was doing well with pain controlled on po analgesics the following am. He was NVI and stable medically and orthopedically. The remainder of the hospital course was dedicated to ambulation and strengthening.   The patient was discharged on 1 Day Post-Op in  Good condition.

## 2012-04-14 NOTE — Progress Notes (Signed)
OT Note Eval completed and filed in shadow chart. All education completed. Pt will have necessary level of A at d/c. Progress rehab of the shoulder as ordered by MD post f/u appointment. Will sign off.  Garrel Ridgel, OTR/L  Pager [phone] 04/14/2012

## 2012-04-16 DIAGNOSIS — I10 Essential (primary) hypertension: Secondary | ICD-10-CM | POA: Diagnosis not present

## 2012-04-16 DIAGNOSIS — M1710 Unilateral primary osteoarthritis, unspecified knee: Secondary | ICD-10-CM | POA: Diagnosis not present

## 2012-04-16 DIAGNOSIS — IMO0001 Reserved for inherently not codable concepts without codable children: Secondary | ICD-10-CM | POA: Diagnosis not present

## 2012-04-16 DIAGNOSIS — Z471 Aftercare following joint replacement surgery: Secondary | ICD-10-CM | POA: Diagnosis not present

## 2012-04-16 DIAGNOSIS — M19019 Primary osteoarthritis, unspecified shoulder: Secondary | ICD-10-CM | POA: Diagnosis not present

## 2012-04-16 DIAGNOSIS — Z96619 Presence of unspecified artificial shoulder joint: Secondary | ICD-10-CM | POA: Diagnosis not present

## 2012-04-18 DIAGNOSIS — IMO0001 Reserved for inherently not codable concepts without codable children: Secondary | ICD-10-CM | POA: Diagnosis not present

## 2012-04-18 DIAGNOSIS — I10 Essential (primary) hypertension: Secondary | ICD-10-CM | POA: Diagnosis not present

## 2012-04-18 DIAGNOSIS — Z471 Aftercare following joint replacement surgery: Secondary | ICD-10-CM | POA: Diagnosis not present

## 2012-04-18 DIAGNOSIS — M1710 Unilateral primary osteoarthritis, unspecified knee: Secondary | ICD-10-CM | POA: Diagnosis not present

## 2012-04-18 DIAGNOSIS — Z96619 Presence of unspecified artificial shoulder joint: Secondary | ICD-10-CM | POA: Diagnosis not present

## 2012-04-19 DIAGNOSIS — M19019 Primary osteoarthritis, unspecified shoulder: Secondary | ICD-10-CM | POA: Diagnosis not present

## 2012-04-21 DIAGNOSIS — Z471 Aftercare following joint replacement surgery: Secondary | ICD-10-CM | POA: Diagnosis not present

## 2012-04-21 DIAGNOSIS — I10 Essential (primary) hypertension: Secondary | ICD-10-CM | POA: Diagnosis not present

## 2012-04-21 DIAGNOSIS — IMO0001 Reserved for inherently not codable concepts without codable children: Secondary | ICD-10-CM | POA: Diagnosis not present

## 2012-04-21 DIAGNOSIS — M1710 Unilateral primary osteoarthritis, unspecified knee: Secondary | ICD-10-CM | POA: Diagnosis not present

## 2012-04-21 DIAGNOSIS — Z96619 Presence of unspecified artificial shoulder joint: Secondary | ICD-10-CM | POA: Diagnosis not present

## 2012-04-25 DIAGNOSIS — IMO0001 Reserved for inherently not codable concepts without codable children: Secondary | ICD-10-CM | POA: Diagnosis not present

## 2012-04-25 DIAGNOSIS — Z96619 Presence of unspecified artificial shoulder joint: Secondary | ICD-10-CM | POA: Diagnosis not present

## 2012-04-25 DIAGNOSIS — M1710 Unilateral primary osteoarthritis, unspecified knee: Secondary | ICD-10-CM | POA: Diagnosis not present

## 2012-04-25 DIAGNOSIS — Z471 Aftercare following joint replacement surgery: Secondary | ICD-10-CM | POA: Diagnosis not present

## 2012-04-25 DIAGNOSIS — I10 Essential (primary) hypertension: Secondary | ICD-10-CM | POA: Diagnosis not present

## 2012-04-26 DIAGNOSIS — E291 Testicular hypofunction: Secondary | ICD-10-CM | POA: Diagnosis not present

## 2012-04-27 DIAGNOSIS — M1710 Unilateral primary osteoarthritis, unspecified knee: Secondary | ICD-10-CM | POA: Diagnosis not present

## 2012-04-27 DIAGNOSIS — IMO0001 Reserved for inherently not codable concepts without codable children: Secondary | ICD-10-CM | POA: Diagnosis not present

## 2012-04-27 DIAGNOSIS — Z96619 Presence of unspecified artificial shoulder joint: Secondary | ICD-10-CM | POA: Diagnosis not present

## 2012-04-27 DIAGNOSIS — Z471 Aftercare following joint replacement surgery: Secondary | ICD-10-CM | POA: Diagnosis not present

## 2012-04-27 DIAGNOSIS — I10 Essential (primary) hypertension: Secondary | ICD-10-CM | POA: Diagnosis not present

## 2012-05-01 DIAGNOSIS — M1710 Unilateral primary osteoarthritis, unspecified knee: Secondary | ICD-10-CM | POA: Diagnosis not present

## 2012-05-01 DIAGNOSIS — I10 Essential (primary) hypertension: Secondary | ICD-10-CM | POA: Diagnosis not present

## 2012-05-01 DIAGNOSIS — Z96619 Presence of unspecified artificial shoulder joint: Secondary | ICD-10-CM | POA: Diagnosis not present

## 2012-05-01 DIAGNOSIS — IMO0001 Reserved for inherently not codable concepts without codable children: Secondary | ICD-10-CM | POA: Diagnosis not present

## 2012-05-01 DIAGNOSIS — Z471 Aftercare following joint replacement surgery: Secondary | ICD-10-CM | POA: Diagnosis not present

## 2012-05-03 DIAGNOSIS — IMO0001 Reserved for inherently not codable concepts without codable children: Secondary | ICD-10-CM | POA: Diagnosis not present

## 2012-05-03 DIAGNOSIS — M1710 Unilateral primary osteoarthritis, unspecified knee: Secondary | ICD-10-CM | POA: Diagnosis not present

## 2012-05-03 DIAGNOSIS — I10 Essential (primary) hypertension: Secondary | ICD-10-CM | POA: Diagnosis not present

## 2012-05-03 DIAGNOSIS — Z96619 Presence of unspecified artificial shoulder joint: Secondary | ICD-10-CM | POA: Diagnosis not present

## 2012-05-03 DIAGNOSIS — Z471 Aftercare following joint replacement surgery: Secondary | ICD-10-CM | POA: Diagnosis not present

## 2012-05-10 DIAGNOSIS — Z96619 Presence of unspecified artificial shoulder joint: Secondary | ICD-10-CM | POA: Diagnosis not present

## 2012-05-10 DIAGNOSIS — M25619 Stiffness of unspecified shoulder, not elsewhere classified: Secondary | ICD-10-CM | POA: Diagnosis not present

## 2012-05-10 DIAGNOSIS — M6281 Muscle weakness (generalized): Secondary | ICD-10-CM | POA: Diagnosis not present

## 2012-05-10 DIAGNOSIS — M25519 Pain in unspecified shoulder: Secondary | ICD-10-CM | POA: Diagnosis not present

## 2012-05-15 DIAGNOSIS — I10 Essential (primary) hypertension: Secondary | ICD-10-CM | POA: Diagnosis not present

## 2012-05-15 DIAGNOSIS — E785 Hyperlipidemia, unspecified: Secondary | ICD-10-CM | POA: Diagnosis not present

## 2012-05-15 DIAGNOSIS — M1990 Unspecified osteoarthritis, unspecified site: Secondary | ICD-10-CM | POA: Diagnosis not present

## 2012-05-15 DIAGNOSIS — E291 Testicular hypofunction: Secondary | ICD-10-CM | POA: Diagnosis not present

## 2012-05-16 DIAGNOSIS — M25619 Stiffness of unspecified shoulder, not elsewhere classified: Secondary | ICD-10-CM | POA: Diagnosis not present

## 2012-05-16 DIAGNOSIS — Z96619 Presence of unspecified artificial shoulder joint: Secondary | ICD-10-CM | POA: Diagnosis not present

## 2012-05-16 DIAGNOSIS — M25519 Pain in unspecified shoulder: Secondary | ICD-10-CM | POA: Diagnosis not present

## 2012-05-16 DIAGNOSIS — M6281 Muscle weakness (generalized): Secondary | ICD-10-CM | POA: Diagnosis not present

## 2012-05-19 DIAGNOSIS — M6281 Muscle weakness (generalized): Secondary | ICD-10-CM | POA: Diagnosis not present

## 2012-05-19 DIAGNOSIS — M25619 Stiffness of unspecified shoulder, not elsewhere classified: Secondary | ICD-10-CM | POA: Diagnosis not present

## 2012-05-19 DIAGNOSIS — M25519 Pain in unspecified shoulder: Secondary | ICD-10-CM | POA: Diagnosis not present

## 2012-05-19 DIAGNOSIS — Z96619 Presence of unspecified artificial shoulder joint: Secondary | ICD-10-CM | POA: Diagnosis not present

## 2012-05-23 DIAGNOSIS — M25619 Stiffness of unspecified shoulder, not elsewhere classified: Secondary | ICD-10-CM | POA: Diagnosis not present

## 2012-05-23 DIAGNOSIS — Z96619 Presence of unspecified artificial shoulder joint: Secondary | ICD-10-CM | POA: Diagnosis not present

## 2012-05-23 DIAGNOSIS — M25519 Pain in unspecified shoulder: Secondary | ICD-10-CM | POA: Diagnosis not present

## 2012-05-23 DIAGNOSIS — M6281 Muscle weakness (generalized): Secondary | ICD-10-CM | POA: Diagnosis not present

## 2012-05-25 DIAGNOSIS — M25519 Pain in unspecified shoulder: Secondary | ICD-10-CM | POA: Diagnosis not present

## 2012-05-25 DIAGNOSIS — M6281 Muscle weakness (generalized): Secondary | ICD-10-CM | POA: Diagnosis not present

## 2012-05-25 DIAGNOSIS — M25619 Stiffness of unspecified shoulder, not elsewhere classified: Secondary | ICD-10-CM | POA: Diagnosis not present

## 2012-05-25 DIAGNOSIS — Z96619 Presence of unspecified artificial shoulder joint: Secondary | ICD-10-CM | POA: Diagnosis not present

## 2012-05-29 DIAGNOSIS — M25519 Pain in unspecified shoulder: Secondary | ICD-10-CM | POA: Diagnosis not present

## 2012-05-29 DIAGNOSIS — Z96619 Presence of unspecified artificial shoulder joint: Secondary | ICD-10-CM | POA: Diagnosis not present

## 2012-05-29 DIAGNOSIS — M25619 Stiffness of unspecified shoulder, not elsewhere classified: Secondary | ICD-10-CM | POA: Diagnosis not present

## 2012-05-29 DIAGNOSIS — M6281 Muscle weakness (generalized): Secondary | ICD-10-CM | POA: Diagnosis not present

## 2012-05-30 DIAGNOSIS — E291 Testicular hypofunction: Secondary | ICD-10-CM | POA: Diagnosis not present

## 2012-05-31 DIAGNOSIS — M25519 Pain in unspecified shoulder: Secondary | ICD-10-CM | POA: Diagnosis not present

## 2012-05-31 DIAGNOSIS — M6281 Muscle weakness (generalized): Secondary | ICD-10-CM | POA: Diagnosis not present

## 2012-05-31 DIAGNOSIS — Z96619 Presence of unspecified artificial shoulder joint: Secondary | ICD-10-CM | POA: Diagnosis not present

## 2012-05-31 DIAGNOSIS — M25619 Stiffness of unspecified shoulder, not elsewhere classified: Secondary | ICD-10-CM | POA: Diagnosis not present

## 2012-06-06 DIAGNOSIS — M25619 Stiffness of unspecified shoulder, not elsewhere classified: Secondary | ICD-10-CM | POA: Diagnosis not present

## 2012-06-06 DIAGNOSIS — M25519 Pain in unspecified shoulder: Secondary | ICD-10-CM | POA: Diagnosis not present

## 2012-06-06 DIAGNOSIS — M6281 Muscle weakness (generalized): Secondary | ICD-10-CM | POA: Diagnosis not present

## 2012-06-06 DIAGNOSIS — Z96619 Presence of unspecified artificial shoulder joint: Secondary | ICD-10-CM | POA: Diagnosis not present

## 2012-06-07 DIAGNOSIS — M25519 Pain in unspecified shoulder: Secondary | ICD-10-CM | POA: Diagnosis not present

## 2012-06-07 DIAGNOSIS — M6281 Muscle weakness (generalized): Secondary | ICD-10-CM | POA: Diagnosis not present

## 2012-06-07 DIAGNOSIS — Z96619 Presence of unspecified artificial shoulder joint: Secondary | ICD-10-CM | POA: Diagnosis not present

## 2012-06-07 DIAGNOSIS — M25619 Stiffness of unspecified shoulder, not elsewhere classified: Secondary | ICD-10-CM | POA: Diagnosis not present

## 2012-06-09 DIAGNOSIS — M25619 Stiffness of unspecified shoulder, not elsewhere classified: Secondary | ICD-10-CM | POA: Diagnosis not present

## 2012-06-09 DIAGNOSIS — M6281 Muscle weakness (generalized): Secondary | ICD-10-CM | POA: Diagnosis not present

## 2012-06-09 DIAGNOSIS — Z96619 Presence of unspecified artificial shoulder joint: Secondary | ICD-10-CM | POA: Diagnosis not present

## 2012-06-09 DIAGNOSIS — M25519 Pain in unspecified shoulder: Secondary | ICD-10-CM | POA: Diagnosis not present

## 2012-06-12 DIAGNOSIS — M25519 Pain in unspecified shoulder: Secondary | ICD-10-CM | POA: Diagnosis not present

## 2012-06-12 DIAGNOSIS — M6281 Muscle weakness (generalized): Secondary | ICD-10-CM | POA: Diagnosis not present

## 2012-06-12 DIAGNOSIS — M25619 Stiffness of unspecified shoulder, not elsewhere classified: Secondary | ICD-10-CM | POA: Diagnosis not present

## 2012-06-12 DIAGNOSIS — Z96619 Presence of unspecified artificial shoulder joint: Secondary | ICD-10-CM | POA: Diagnosis not present

## 2012-06-14 DIAGNOSIS — D126 Benign neoplasm of colon, unspecified: Secondary | ICD-10-CM | POA: Diagnosis not present

## 2012-06-14 DIAGNOSIS — Z1211 Encounter for screening for malignant neoplasm of colon: Secondary | ICD-10-CM | POA: Diagnosis not present

## 2012-06-14 DIAGNOSIS — K5730 Diverticulosis of large intestine without perforation or abscess without bleeding: Secondary | ICD-10-CM | POA: Diagnosis not present

## 2012-06-15 DIAGNOSIS — Z96619 Presence of unspecified artificial shoulder joint: Secondary | ICD-10-CM | POA: Diagnosis not present

## 2012-06-15 DIAGNOSIS — E291 Testicular hypofunction: Secondary | ICD-10-CM | POA: Diagnosis not present

## 2012-06-15 DIAGNOSIS — M6281 Muscle weakness (generalized): Secondary | ICD-10-CM | POA: Diagnosis not present

## 2012-06-15 DIAGNOSIS — M25519 Pain in unspecified shoulder: Secondary | ICD-10-CM | POA: Diagnosis not present

## 2012-06-15 DIAGNOSIS — M25619 Stiffness of unspecified shoulder, not elsewhere classified: Secondary | ICD-10-CM | POA: Diagnosis not present

## 2012-06-16 DIAGNOSIS — M19019 Primary osteoarthritis, unspecified shoulder: Secondary | ICD-10-CM | POA: Diagnosis not present

## 2012-06-19 DIAGNOSIS — M25519 Pain in unspecified shoulder: Secondary | ICD-10-CM | POA: Diagnosis not present

## 2012-06-19 DIAGNOSIS — M6281 Muscle weakness (generalized): Secondary | ICD-10-CM | POA: Diagnosis not present

## 2012-06-19 DIAGNOSIS — M25619 Stiffness of unspecified shoulder, not elsewhere classified: Secondary | ICD-10-CM | POA: Diagnosis not present

## 2012-06-19 DIAGNOSIS — Z96619 Presence of unspecified artificial shoulder joint: Secondary | ICD-10-CM | POA: Diagnosis not present

## 2012-06-22 DIAGNOSIS — M25619 Stiffness of unspecified shoulder, not elsewhere classified: Secondary | ICD-10-CM | POA: Diagnosis not present

## 2012-06-22 DIAGNOSIS — M25519 Pain in unspecified shoulder: Secondary | ICD-10-CM | POA: Diagnosis not present

## 2012-06-22 DIAGNOSIS — M6281 Muscle weakness (generalized): Secondary | ICD-10-CM | POA: Diagnosis not present

## 2012-06-22 DIAGNOSIS — Z96619 Presence of unspecified artificial shoulder joint: Secondary | ICD-10-CM | POA: Diagnosis not present

## 2012-06-28 DIAGNOSIS — M25519 Pain in unspecified shoulder: Secondary | ICD-10-CM | POA: Diagnosis not present

## 2012-06-28 DIAGNOSIS — Z96619 Presence of unspecified artificial shoulder joint: Secondary | ICD-10-CM | POA: Diagnosis not present

## 2012-06-28 DIAGNOSIS — M6281 Muscle weakness (generalized): Secondary | ICD-10-CM | POA: Diagnosis not present

## 2012-06-28 DIAGNOSIS — M25619 Stiffness of unspecified shoulder, not elsewhere classified: Secondary | ICD-10-CM | POA: Diagnosis not present

## 2012-06-29 DIAGNOSIS — E291 Testicular hypofunction: Secondary | ICD-10-CM | POA: Diagnosis not present

## 2012-06-30 DIAGNOSIS — M6281 Muscle weakness (generalized): Secondary | ICD-10-CM | POA: Diagnosis not present

## 2012-06-30 DIAGNOSIS — M25619 Stiffness of unspecified shoulder, not elsewhere classified: Secondary | ICD-10-CM | POA: Diagnosis not present

## 2012-06-30 DIAGNOSIS — Z96619 Presence of unspecified artificial shoulder joint: Secondary | ICD-10-CM | POA: Diagnosis not present

## 2012-06-30 DIAGNOSIS — M25519 Pain in unspecified shoulder: Secondary | ICD-10-CM | POA: Diagnosis not present

## 2012-07-05 DIAGNOSIS — Z96619 Presence of unspecified artificial shoulder joint: Secondary | ICD-10-CM | POA: Diagnosis not present

## 2012-07-05 DIAGNOSIS — M6281 Muscle weakness (generalized): Secondary | ICD-10-CM | POA: Diagnosis not present

## 2012-07-05 DIAGNOSIS — M25619 Stiffness of unspecified shoulder, not elsewhere classified: Secondary | ICD-10-CM | POA: Diagnosis not present

## 2012-07-05 DIAGNOSIS — M25519 Pain in unspecified shoulder: Secondary | ICD-10-CM | POA: Diagnosis not present

## 2012-07-07 DIAGNOSIS — M25619 Stiffness of unspecified shoulder, not elsewhere classified: Secondary | ICD-10-CM | POA: Diagnosis not present

## 2012-07-07 DIAGNOSIS — Z96619 Presence of unspecified artificial shoulder joint: Secondary | ICD-10-CM | POA: Diagnosis not present

## 2012-07-07 DIAGNOSIS — M6281 Muscle weakness (generalized): Secondary | ICD-10-CM | POA: Diagnosis not present

## 2012-07-07 DIAGNOSIS — M25519 Pain in unspecified shoulder: Secondary | ICD-10-CM | POA: Diagnosis not present

## 2012-07-10 DIAGNOSIS — M6281 Muscle weakness (generalized): Secondary | ICD-10-CM | POA: Diagnosis not present

## 2012-07-10 DIAGNOSIS — Z96619 Presence of unspecified artificial shoulder joint: Secondary | ICD-10-CM | POA: Diagnosis not present

## 2012-07-10 DIAGNOSIS — M25619 Stiffness of unspecified shoulder, not elsewhere classified: Secondary | ICD-10-CM | POA: Diagnosis not present

## 2012-07-10 DIAGNOSIS — M25519 Pain in unspecified shoulder: Secondary | ICD-10-CM | POA: Diagnosis not present

## 2012-07-13 DIAGNOSIS — E291 Testicular hypofunction: Secondary | ICD-10-CM | POA: Diagnosis not present

## 2012-07-17 DIAGNOSIS — M1710 Unilateral primary osteoarthritis, unspecified knee: Secondary | ICD-10-CM | POA: Diagnosis not present

## 2012-07-19 DIAGNOSIS — M6281 Muscle weakness (generalized): Secondary | ICD-10-CM | POA: Diagnosis not present

## 2012-07-19 DIAGNOSIS — Z96619 Presence of unspecified artificial shoulder joint: Secondary | ICD-10-CM | POA: Diagnosis not present

## 2012-07-19 DIAGNOSIS — M25619 Stiffness of unspecified shoulder, not elsewhere classified: Secondary | ICD-10-CM | POA: Diagnosis not present

## 2012-07-19 DIAGNOSIS — M25519 Pain in unspecified shoulder: Secondary | ICD-10-CM | POA: Diagnosis not present

## 2012-07-21 DIAGNOSIS — M25619 Stiffness of unspecified shoulder, not elsewhere classified: Secondary | ICD-10-CM | POA: Diagnosis not present

## 2012-07-21 DIAGNOSIS — M6281 Muscle weakness (generalized): Secondary | ICD-10-CM | POA: Diagnosis not present

## 2012-07-21 DIAGNOSIS — M25519 Pain in unspecified shoulder: Secondary | ICD-10-CM | POA: Diagnosis not present

## 2012-07-21 DIAGNOSIS — Z96619 Presence of unspecified artificial shoulder joint: Secondary | ICD-10-CM | POA: Diagnosis not present

## 2012-07-26 DIAGNOSIS — M25619 Stiffness of unspecified shoulder, not elsewhere classified: Secondary | ICD-10-CM | POA: Diagnosis not present

## 2012-07-26 DIAGNOSIS — M25519 Pain in unspecified shoulder: Secondary | ICD-10-CM | POA: Diagnosis not present

## 2012-07-26 DIAGNOSIS — M6281 Muscle weakness (generalized): Secondary | ICD-10-CM | POA: Diagnosis not present

## 2012-07-26 DIAGNOSIS — Z96619 Presence of unspecified artificial shoulder joint: Secondary | ICD-10-CM | POA: Diagnosis not present

## 2012-07-27 DIAGNOSIS — E291 Testicular hypofunction: Secondary | ICD-10-CM | POA: Diagnosis not present

## 2012-08-01 DIAGNOSIS — M25619 Stiffness of unspecified shoulder, not elsewhere classified: Secondary | ICD-10-CM | POA: Diagnosis not present

## 2012-08-01 DIAGNOSIS — Z96619 Presence of unspecified artificial shoulder joint: Secondary | ICD-10-CM | POA: Diagnosis not present

## 2012-08-01 DIAGNOSIS — M25519 Pain in unspecified shoulder: Secondary | ICD-10-CM | POA: Diagnosis not present

## 2012-08-01 DIAGNOSIS — M6281 Muscle weakness (generalized): Secondary | ICD-10-CM | POA: Diagnosis not present

## 2012-08-03 DIAGNOSIS — Z96619 Presence of unspecified artificial shoulder joint: Secondary | ICD-10-CM | POA: Diagnosis not present

## 2012-08-03 DIAGNOSIS — M6281 Muscle weakness (generalized): Secondary | ICD-10-CM | POA: Diagnosis not present

## 2012-08-03 DIAGNOSIS — M25519 Pain in unspecified shoulder: Secondary | ICD-10-CM | POA: Diagnosis not present

## 2012-08-03 DIAGNOSIS — M25619 Stiffness of unspecified shoulder, not elsewhere classified: Secondary | ICD-10-CM | POA: Diagnosis not present

## 2012-08-07 DIAGNOSIS — Z96619 Presence of unspecified artificial shoulder joint: Secondary | ICD-10-CM | POA: Diagnosis not present

## 2012-08-07 DIAGNOSIS — M25519 Pain in unspecified shoulder: Secondary | ICD-10-CM | POA: Diagnosis not present

## 2012-08-07 DIAGNOSIS — M6281 Muscle weakness (generalized): Secondary | ICD-10-CM | POA: Diagnosis not present

## 2012-08-07 DIAGNOSIS — M25619 Stiffness of unspecified shoulder, not elsewhere classified: Secondary | ICD-10-CM | POA: Diagnosis not present

## 2012-08-09 DIAGNOSIS — M25619 Stiffness of unspecified shoulder, not elsewhere classified: Secondary | ICD-10-CM | POA: Diagnosis not present

## 2012-08-09 DIAGNOSIS — Z96619 Presence of unspecified artificial shoulder joint: Secondary | ICD-10-CM | POA: Diagnosis not present

## 2012-08-09 DIAGNOSIS — M25519 Pain in unspecified shoulder: Secondary | ICD-10-CM | POA: Diagnosis not present

## 2012-08-09 DIAGNOSIS — M6281 Muscle weakness (generalized): Secondary | ICD-10-CM | POA: Diagnosis not present

## 2012-08-10 DIAGNOSIS — E291 Testicular hypofunction: Secondary | ICD-10-CM | POA: Diagnosis not present

## 2012-08-24 DIAGNOSIS — E291 Testicular hypofunction: Secondary | ICD-10-CM | POA: Diagnosis not present

## 2012-08-28 DIAGNOSIS — E291 Testicular hypofunction: Secondary | ICD-10-CM | POA: Diagnosis not present

## 2012-08-28 DIAGNOSIS — M1710 Unilateral primary osteoarthritis, unspecified knee: Secondary | ICD-10-CM | POA: Diagnosis not present

## 2012-08-28 DIAGNOSIS — E785 Hyperlipidemia, unspecified: Secondary | ICD-10-CM | POA: Diagnosis not present

## 2012-08-28 DIAGNOSIS — D649 Anemia, unspecified: Secondary | ICD-10-CM | POA: Diagnosis not present

## 2012-08-28 DIAGNOSIS — I10 Essential (primary) hypertension: Secondary | ICD-10-CM | POA: Diagnosis not present

## 2012-08-30 DIAGNOSIS — M1710 Unilateral primary osteoarthritis, unspecified knee: Secondary | ICD-10-CM | POA: Diagnosis not present

## 2012-09-07 DIAGNOSIS — E291 Testicular hypofunction: Secondary | ICD-10-CM | POA: Diagnosis not present

## 2012-10-04 DIAGNOSIS — Z23 Encounter for immunization: Secondary | ICD-10-CM | POA: Diagnosis not present

## 2012-10-04 DIAGNOSIS — E7800 Pure hypercholesterolemia, unspecified: Secondary | ICD-10-CM | POA: Diagnosis not present

## 2012-10-04 DIAGNOSIS — F17200 Nicotine dependence, unspecified, uncomplicated: Secondary | ICD-10-CM | POA: Diagnosis not present

## 2012-10-04 DIAGNOSIS — I10 Essential (primary) hypertension: Secondary | ICD-10-CM | POA: Diagnosis not present

## 2012-10-04 DIAGNOSIS — E291 Testicular hypofunction: Secondary | ICD-10-CM | POA: Diagnosis not present

## 2012-10-19 DIAGNOSIS — E291 Testicular hypofunction: Secondary | ICD-10-CM | POA: Diagnosis not present

## 2012-10-25 DIAGNOSIS — E291 Testicular hypofunction: Secondary | ICD-10-CM | POA: Diagnosis not present

## 2012-10-25 DIAGNOSIS — I10 Essential (primary) hypertension: Secondary | ICD-10-CM | POA: Diagnosis not present

## 2012-10-25 DIAGNOSIS — F411 Generalized anxiety disorder: Secondary | ICD-10-CM | POA: Diagnosis not present

## 2012-10-25 DIAGNOSIS — M1990 Unspecified osteoarthritis, unspecified site: Secondary | ICD-10-CM | POA: Diagnosis not present

## 2012-11-02 DIAGNOSIS — E291 Testicular hypofunction: Secondary | ICD-10-CM | POA: Diagnosis not present

## 2012-11-17 DIAGNOSIS — E291 Testicular hypofunction: Secondary | ICD-10-CM | POA: Diagnosis not present

## 2012-11-27 DIAGNOSIS — M1990 Unspecified osteoarthritis, unspecified site: Secondary | ICD-10-CM | POA: Diagnosis not present

## 2012-11-27 DIAGNOSIS — Z79899 Other long term (current) drug therapy: Secondary | ICD-10-CM | POA: Diagnosis not present

## 2012-11-27 DIAGNOSIS — F17200 Nicotine dependence, unspecified, uncomplicated: Secondary | ICD-10-CM | POA: Diagnosis not present

## 2012-11-27 DIAGNOSIS — G894 Chronic pain syndrome: Secondary | ICD-10-CM | POA: Diagnosis not present

## 2012-11-27 DIAGNOSIS — Z5181 Encounter for therapeutic drug level monitoring: Secondary | ICD-10-CM | POA: Diagnosis not present

## 2012-12-01 DIAGNOSIS — E291 Testicular hypofunction: Secondary | ICD-10-CM | POA: Diagnosis not present

## 2012-12-06 DIAGNOSIS — E7800 Pure hypercholesterolemia, unspecified: Secondary | ICD-10-CM | POA: Diagnosis not present

## 2012-12-06 DIAGNOSIS — N400 Enlarged prostate without lower urinary tract symptoms: Secondary | ICD-10-CM | POA: Diagnosis not present

## 2012-12-06 DIAGNOSIS — J069 Acute upper respiratory infection, unspecified: Secondary | ICD-10-CM | POA: Diagnosis not present

## 2012-12-06 DIAGNOSIS — F411 Generalized anxiety disorder: Secondary | ICD-10-CM | POA: Diagnosis not present

## 2012-12-15 DIAGNOSIS — E291 Testicular hypofunction: Secondary | ICD-10-CM | POA: Diagnosis not present

## 2012-12-22 DIAGNOSIS — G894 Chronic pain syndrome: Secondary | ICD-10-CM | POA: Diagnosis not present

## 2012-12-22 DIAGNOSIS — M25569 Pain in unspecified knee: Secondary | ICD-10-CM | POA: Diagnosis not present

## 2012-12-22 DIAGNOSIS — M25519 Pain in unspecified shoulder: Secondary | ICD-10-CM | POA: Diagnosis not present

## 2012-12-22 DIAGNOSIS — M1990 Unspecified osteoarthritis, unspecified site: Secondary | ICD-10-CM | POA: Diagnosis not present

## 2012-12-29 DIAGNOSIS — E291 Testicular hypofunction: Secondary | ICD-10-CM | POA: Diagnosis not present

## 2013-01-18 DIAGNOSIS — E7800 Pure hypercholesterolemia, unspecified: Secondary | ICD-10-CM | POA: Diagnosis not present

## 2013-01-18 DIAGNOSIS — Z125 Encounter for screening for malignant neoplasm of prostate: Secondary | ICD-10-CM | POA: Diagnosis not present

## 2013-01-18 DIAGNOSIS — E291 Testicular hypofunction: Secondary | ICD-10-CM | POA: Diagnosis not present

## 2013-01-18 DIAGNOSIS — Z79899 Other long term (current) drug therapy: Secondary | ICD-10-CM | POA: Diagnosis not present

## 2013-02-01 DIAGNOSIS — E291 Testicular hypofunction: Secondary | ICD-10-CM | POA: Diagnosis not present

## 2013-02-15 DIAGNOSIS — M25569 Pain in unspecified knee: Secondary | ICD-10-CM | POA: Diagnosis not present

## 2013-02-15 DIAGNOSIS — E119 Type 2 diabetes mellitus without complications: Secondary | ICD-10-CM | POA: Diagnosis not present

## 2013-02-15 DIAGNOSIS — M1990 Unspecified osteoarthritis, unspecified site: Secondary | ICD-10-CM | POA: Diagnosis not present

## 2013-02-15 DIAGNOSIS — E7800 Pure hypercholesterolemia, unspecified: Secondary | ICD-10-CM | POA: Diagnosis not present

## 2013-02-15 DIAGNOSIS — G894 Chronic pain syndrome: Secondary | ICD-10-CM | POA: Diagnosis not present

## 2013-02-15 DIAGNOSIS — F411 Generalized anxiety disorder: Secondary | ICD-10-CM | POA: Diagnosis not present

## 2013-02-15 DIAGNOSIS — M25519 Pain in unspecified shoulder: Secondary | ICD-10-CM | POA: Diagnosis not present

## 2013-02-15 DIAGNOSIS — M79609 Pain in unspecified limb: Secondary | ICD-10-CM | POA: Diagnosis not present

## 2013-02-15 DIAGNOSIS — N400 Enlarged prostate without lower urinary tract symptoms: Secondary | ICD-10-CM | POA: Diagnosis not present

## 2013-03-01 DIAGNOSIS — E291 Testicular hypofunction: Secondary | ICD-10-CM | POA: Diagnosis not present

## 2013-03-02 DIAGNOSIS — M1710 Unilateral primary osteoarthritis, unspecified knee: Secondary | ICD-10-CM | POA: Diagnosis not present

## 2013-03-23 DIAGNOSIS — E291 Testicular hypofunction: Secondary | ICD-10-CM | POA: Diagnosis not present

## 2013-04-06 DIAGNOSIS — E291 Testicular hypofunction: Secondary | ICD-10-CM | POA: Diagnosis not present

## 2013-04-13 DIAGNOSIS — M1710 Unilateral primary osteoarthritis, unspecified knee: Secondary | ICD-10-CM | POA: Diagnosis not present

## 2013-04-30 DIAGNOSIS — E291 Testicular hypofunction: Secondary | ICD-10-CM | POA: Diagnosis not present

## 2013-05-18 DIAGNOSIS — E291 Testicular hypofunction: Secondary | ICD-10-CM | POA: Diagnosis not present

## 2013-05-18 DIAGNOSIS — Z79899 Other long term (current) drug therapy: Secondary | ICD-10-CM | POA: Diagnosis not present

## 2013-05-18 DIAGNOSIS — M79609 Pain in unspecified limb: Secondary | ICD-10-CM | POA: Diagnosis not present

## 2013-05-18 DIAGNOSIS — R5383 Other fatigue: Secondary | ICD-10-CM | POA: Diagnosis not present

## 2013-05-18 DIAGNOSIS — I731 Thromboangiitis obliterans [Buerger's disease]: Secondary | ICD-10-CM | POA: Diagnosis not present

## 2013-05-18 DIAGNOSIS — R5381 Other malaise: Secondary | ICD-10-CM | POA: Diagnosis not present

## 2013-05-18 DIAGNOSIS — D539 Nutritional anemia, unspecified: Secondary | ICD-10-CM | POA: Diagnosis not present

## 2013-05-18 DIAGNOSIS — F17200 Nicotine dependence, unspecified, uncomplicated: Secondary | ICD-10-CM | POA: Diagnosis not present

## 2013-05-23 ENCOUNTER — Encounter (HOSPITAL_COMMUNITY): Payer: Self-pay | Admitting: Pharmacy Technician

## 2013-05-25 ENCOUNTER — Encounter (HOSPITAL_COMMUNITY): Payer: Self-pay

## 2013-05-25 ENCOUNTER — Encounter (HOSPITAL_COMMUNITY)
Admission: RE | Admit: 2013-05-25 | Discharge: 2013-05-25 | Disposition: A | Discharge status: Home / Self Care | Payer: Medicare Other | Source: Ambulatory Visit | Attending: Orthopedic Surgery | Admitting: Orthopedic Surgery

## 2013-05-25 ENCOUNTER — Ambulatory Visit (HOSPITAL_COMMUNITY)
Admission: RE | Admit: 2013-05-25 | Discharge: 2013-05-25 | Disposition: A | Discharge status: Home / Self Care | Payer: Medicare Other | Source: Ambulatory Visit | Attending: Orthopedic Surgery | Admitting: Orthopedic Surgery

## 2013-05-25 ENCOUNTER — Other Ambulatory Visit: Payer: Self-pay

## 2013-05-25 DIAGNOSIS — J40 Bronchitis, not specified as acute or chronic: Secondary | ICD-10-CM | POA: Diagnosis not present

## 2013-05-25 DIAGNOSIS — M19019 Primary osteoarthritis, unspecified shoulder: Secondary | ICD-10-CM | POA: Diagnosis not present

## 2013-05-25 DIAGNOSIS — Z01818 Encounter for other preprocedural examination: Secondary | ICD-10-CM | POA: Insufficient documentation

## 2013-05-25 DIAGNOSIS — M25569 Pain in unspecified knee: Secondary | ICD-10-CM | POA: Diagnosis not present

## 2013-05-25 DIAGNOSIS — M1710 Unilateral primary osteoarthritis, unspecified knee: Secondary | ICD-10-CM | POA: Diagnosis not present

## 2013-05-25 DIAGNOSIS — I10 Essential (primary) hypertension: Secondary | ICD-10-CM | POA: Diagnosis not present

## 2013-05-25 DIAGNOSIS — Z01810 Encounter for preprocedural cardiovascular examination: Secondary | ICD-10-CM | POA: Insufficient documentation

## 2013-05-25 DIAGNOSIS — Z96619 Presence of unspecified artificial shoulder joint: Secondary | ICD-10-CM | POA: Insufficient documentation

## 2013-05-25 DIAGNOSIS — J9819 Other pulmonary collapse: Secondary | ICD-10-CM | POA: Diagnosis not present

## 2013-05-25 LAB — CBC
Hemoglobin: 16.7 g/dL (ref 13.0–17.0)
MCHC: 34.1 g/dL (ref 30.0–36.0)
Platelets: 200 10*3/uL (ref 150–400)

## 2013-05-25 LAB — BASIC METABOLIC PANEL
BUN: 16 mg/dL (ref 6–23)
Calcium: 10.1 mg/dL (ref 8.4–10.5)
GFR calc Af Amer: >90 mL/min (ref >90)
GFR calc non Af Amer: 83 mL/min — ABNORMAL LOW (ref >90)
Potassium: 4.3 mEq/L (ref 3.5–5.1)
Sodium: 141 mEq/L (ref 135–145)

## 2013-05-25 LAB — URINALYSIS, ROUTINE W REFLEX MICROSCOPIC
Bilirubin Urine: NEGATIVE
Glucose, UA: NEGATIVE mg/dL
Hgb urine dipstick: NEGATIVE
Ketones, ur: NEGATIVE mg/dL
pH: 5.5 (ref 5.0–8.0)

## 2013-05-25 LAB — PROTIME-INR
INR: 1.04 (ref 0.00–1.49)
Prothrombin Time: 13.4 seconds (ref 11.6–15.2)

## 2013-05-25 NOTE — Patient Instructions (Signed)
20      Your procedure is scheduled on:  Tuesday 05/29/2013  Report to [facility] at  [phone] AM.  Call this number if you have problems the morning of surgery: [phone]   Remember:             IF YOU USE CPAP,BRING MASK AND TUBING AM OF SURGERY!   Do not eat food or drink liquids AFTER MIDNIGHT!  Take these medicines the morning of surgery with A SIP OF WATER: Paxil   Do not bring valuables to the hospital. [facility] IS NOT RESPONSIBLE FOR ANY BELONGINGS OR VALUABLES.  Wynelle Fanny suitcase in the car. After surgery it may be brought to your room.  For patients admitted to the hospital, checkout time is 11:00 AM the day of              Discharge.    DO NOT WEAR JEWELRY , MAKE-UP, LOTIONS,POWDERS,PERFUMES!             WOMEN -DO NOT SHAVE LEGS OR UNDERARMS 12 HRS. BEFORE SURGERY!               MEN MAY SHAVE AS USUAL!             CONTACTS,DENTURES OR BRIDGEWORK, FALSE EYELASHES MAY NOT BE WORN INTO SURGERY!                                           Patients discharged the day of surgery will not be allowed to drive home. If going home the same day of surgery, must have someone stay with you first 24 hrs.at home and arrange for someone to drive you home from the Hospital.                          YOUR DRIVER IS: significant other-Donna   Special Instructions:             Please read over the following fact sheets that you were given:             1. [facility] PREPARING FOR SURGERY SHEET              2.[facility]SA INFORMATION              3.[facility]                                        Telford Nab.Jerre Diguglielmo,RN,BSN     [phone]                FAILURE TO FOLLOW THESE INSTRUCTIONS MAY RESULT IN CANCELLATION OF YOUR SURGERY!               Patient Signature:___________________________

## 2013-05-25 NOTE — Progress Notes (Signed)
05/25/13 1538  OBSTRUCTIVE SLEEP APNEA  Have you ever been diagnosed with sleep apnea through a sleep study? No  Do you snore loudly (loud enough to be heard through closed doors)?  1  Do you often feel tired, fatigued, or sleepy during the daytime? 0  Has anyone observed you stop breathing during your sleep? 1  Do you have, or are you being treated for high blood pressure? 1  BMI more than 35 kg/m2? 0  Age over 67 years old? 1  Neck circumference greater than 40 cm/18 inches? 0  Gender: 1  Obstructive Sleep Apnea Score 5  Score 4 or greater  Results sent to PCP

## 2013-05-25 NOTE — H&P (Signed)
TOTAL KNEE ADMISSION H&P  Patient is being admitted for left total knee arthroplasty.  Subjective:  Chief Complaint:   Left knee OA / pain.  HPI: Nathaniel Lee, 67 y.o. male, has a history of pain and functional disability in the left knee due to arthritis and has failed non-surgical conservative treatments for greater than 12 weeks to includeNSAID's and/or analgesics, corticosteriod injections, viscosupplementation injections, use of assistive devices and activity modification.  Onset of symptoms was gradual, starting 8 years ago with gradually worsening course since that time. The patient noted no past surgery on the left knee(s).  Patient currently rates pain in the left knee(s) at 9 out of 10 with activity. Patient has night pain, worsening of pain with activity and weight bearing, pain that interferes with activities of daily living, pain with passive range of motion, crepitus and joint swelling.  Patient has evidence of periarticular osteophytes and joint space narrowing by imaging studies.  There is no active signs of infection.  Risks, benefits and expectations were discussed with the patient. Patient understand the risks, benefits and expectations and wishes to proceed with surgery.   D/C Plans:   Home with HHPT  Post-op Meds:    Rx given for ASA, Zanaflex, Iron, Colace and MiraLax  Tranexamic Acid:   To be given  Decadron:    To be given  FYI:    ASA post-op   Past Medical History  Diagnosis Date  . Arthritis     R shoulder, both knees  . Depression   . Anxiety     h/o panic attacks - related to anxiety   . Hypertension     ekg- last one done 2010 at Dr. Jearl Klinefelter office- [place] , sees Dr. Tomasa Blase- [facility] now for PCP, [facility]     Past Surgical History  Procedure Laterality Date  . Carpal tunnel release      L hand - poor results, per pt. - 2012  . Total shoulder arthroplasty  04/13/2012  . Total shoulder arthroplasty  04/13/2012    Procedure: TOTAL SHOULDER  ARTHROPLASTY;  Surgeon: Senaida Lange, MD;  Location: [facility] OR;  Service: Orthopedics;  Laterality: Right;  Right total shoulder    No Known Allergies   History  Substance Use Topics  . Smoking status: Current Every Day Smoker -- 1.50 packs/day for 25 years    Types: Cigarettes  . Smokeless tobacco: Never Used  . 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 Anesthesia problems Neg Hx      Review of Systems  Constitutional: Negative.   HENT: Positive for hearing loss.   Eyes: Negative.   Respiratory: Positive for cough.   Cardiovascular: Negative.   Gastrointestinal: Negative.   Genitourinary: Negative.   Musculoskeletal: Positive for joint pain.  Skin: Negative.   Neurological: Negative.   Endo/Heme/Allergies: Negative.   Psychiatric/Behavioral: Positive for depression. The patient is nervous/anxious.     Objective:  Physical Exam  Constitutional: He is oriented to person, place, and time. He appears well-developed and well-nourished.  HENT:  Head: Normocephalic and atraumatic.  Mouth/Throat: Oropharynx is clear and moist.  Eyes: Pupils are equal, round, and reactive to light.  Neck: Neck supple. No JVD present. No tracheal deviation present. No thyromegaly present.  Cardiovascular: Normal rate, regular rhythm, normal heart sounds and intact distal pulses.  Respiratory: Effort normal and breath sounds normal. No stridor. No respiratory distress. He has no wheezes.  GI: Soft. There is no tenderness. There is no guarding.  Musculoskeletal:       Left knee: He exhibits decreased range of motion, swelling and bony tenderness. He exhibits no effusion, no ecchymosis, no deformity and no erythema. Tenderness found.  Lymphadenopathy:    He has no cervical adenopathy.  Neurological: He is alert and oriented to person, place, and time.  Skin: Skin is warm and dry.   Psychiatric: He has a normal mood and affect.   Labs:  Estimated body mass index is 27.67 kg/(m^2) as calculated from the following:   Height as of 04/13/12: 5\' 4"  (1.626 m).   Weight as of 04/07/12: 73.165 kg (161 lb 4.8 oz).   Imaging Review Plain radiographs demonstrate severe degenerative joint disease of the left knee(s). The overall alignment isneutral. The bone quality appears to be good for age and reported activity level.  Assessment/Plan:  End stage arthritis, left knee   The patient history, physical examination, clinical judgment of the provider and imaging studies are consistent with end stage degenerative joint disease of the left knee(s) and total knee arthroplasty is deemed medically necessary. The treatment options including medical management, injection therapy arthroscopy and arthroplasty were discussed at length. The risks and benefits of total knee arthroplasty were presented and reviewed. The risks due to aseptic loosening, infection, stiffness, patella tracking problems, thromboembolic complications and other imponderables were discussed. The patient acknowledged the explanation, agreed to proceed with the plan and consent was signed. Patient is being admitted for inpatient treatment for surgery, pain control, PT, OT, prophylactic antibiotics, VTE prophylaxis, progressive ambulation and ADL's and discharge planning. The patient is planning to be discharged home with home health services.    Anastasio Auerbach Marnee Sherrard   PAC  05/25/2013, 2:46 PM

## 2013-05-25 NOTE — Progress Notes (Signed)
Office note, cbc with diff, from 05/18/2013 from [facility] on chart.Left foot x-ray from [facility] 05/18/2013 on chart.

## 2013-05-28 MED ORDER — CEFAZOLIN SODIUM-DEXTROSE 2-3 GM-% IV SOLR: 2.0000 g | INTRAVENOUS | Status: DC

## 2013-05-29 ENCOUNTER — Inpatient Hospital Stay (HOSPITAL_COMMUNITY): Payer: Medicare Other | Admitting: Certified Registered Nurse Anesthetist

## 2013-05-29 ENCOUNTER — Inpatient Hospital Stay: Admit: 2013-05-29 | Payer: Self-pay | Admitting: Orthopedic Surgery

## 2013-05-29 ENCOUNTER — Inpatient Hospital Stay (HOSPITAL_COMMUNITY)
Admission: RE | Admit: 2013-05-29 | Discharge: 2013-05-30 | DRG: 470 | Disposition: A | Discharge status: Home Health | Payer: Medicare Other | Source: Ambulatory Visit | Attending: Orthopedic Surgery | Admitting: Orthopedic Surgery

## 2013-05-29 ENCOUNTER — Encounter (HOSPITAL_COMMUNITY): Payer: Self-pay | Admitting: Certified Registered Nurse Anesthetist

## 2013-05-29 ENCOUNTER — Encounter (HOSPITAL_COMMUNITY)
Admission: RE | Disposition: A | Discharge status: Home Health | Payer: Self-pay | Source: Ambulatory Visit | Attending: Orthopedic Surgery

## 2013-05-29 ENCOUNTER — Encounter (HOSPITAL_COMMUNITY): Payer: Self-pay | Admitting: *Deleted

## 2013-05-29 DIAGNOSIS — F411 Generalized anxiety disorder: Secondary | ICD-10-CM | POA: Diagnosis not present

## 2013-05-29 DIAGNOSIS — F3289 Other specified depressive episodes: Secondary | ICD-10-CM | POA: Diagnosis present

## 2013-05-29 DIAGNOSIS — E663 Overweight: Secondary | ICD-10-CM

## 2013-05-29 DIAGNOSIS — Z96652 Presence of left artificial knee joint: Secondary | ICD-10-CM

## 2013-05-29 DIAGNOSIS — F329 Major depressive disorder, single episode, unspecified: Secondary | ICD-10-CM | POA: Diagnosis present

## 2013-05-29 DIAGNOSIS — D500 Iron deficiency anemia secondary to blood loss (chronic): Secondary | ICD-10-CM | POA: Diagnosis not present

## 2013-05-29 DIAGNOSIS — M25569 Pain in unspecified knee: Secondary | ICD-10-CM | POA: Diagnosis not present

## 2013-05-29 DIAGNOSIS — I10 Essential (primary) hypertension: Secondary | ICD-10-CM | POA: Diagnosis not present

## 2013-05-29 DIAGNOSIS — D62 Acute posthemorrhagic anemia: Secondary | ICD-10-CM | POA: Diagnosis not present

## 2013-05-29 DIAGNOSIS — M1710 Unilateral primary osteoarthritis, unspecified knee: Principal | ICD-10-CM | POA: Diagnosis present

## 2013-05-29 DIAGNOSIS — Z96659 Presence of unspecified artificial knee joint: Secondary | ICD-10-CM

## 2013-05-29 DIAGNOSIS — IMO0002 Reserved for concepts with insufficient information to code with codable children: Secondary | ICD-10-CM | POA: Diagnosis not present

## 2013-05-29 DIAGNOSIS — F17200 Nicotine dependence, unspecified, uncomplicated: Secondary | ICD-10-CM | POA: Diagnosis not present

## 2013-05-29 HISTORY — PX: TOTAL KNEE ARTHROPLASTY: SHX125

## 2013-05-29 LAB — TYPE AND SCREEN

## 2013-05-29 SURGERY — ARTHROPLASTY, KNEE, TOTAL
Anesthesia: Spinal | Site: Knee | Laterality: Left | Wound class: Clean

## 2013-05-29 SURGERY — ARTHROPLASTY, KNEE, TOTAL
Anesthesia: Spinal | Site: Knee | Laterality: Left

## 2013-05-29 MED ORDER — HYDROCORTISONE ACE-PRAMOXINE 2.5-1 % RE CREA
1.0000 | TOPICAL_CREAM | Freq: Every day | RECTAL | Status: DC | PRN
Start: 2013-05-29 — End: 2013-05-30
  Filled 2013-05-29: qty 30

## 2013-05-29 MED ORDER — MIDAZOLAM HCL 5 MG/5ML IJ SOLN
INTRAMUSCULAR | Status: DC | PRN
  Administered 2013-05-29: 2 mg via INTRAVENOUS

## 2013-05-29 MED ORDER — DEXAMETHASONE SODIUM PHOSPHATE 10 MG/ML IJ SOLN
10.0000 mg | Freq: Once | INTRAMUSCULAR | Status: AC
  Administered 2013-05-30: 10 mg via INTRAVENOUS
  Filled 2013-05-29: qty 1

## 2013-05-29 MED ORDER — PAROXETINE HCL 20 MG PO TABS
40.0000 mg | ORAL_TABLET | Freq: Every morning | ORAL | Status: DC
  Administered 2013-05-30: 40 mg via ORAL
  Filled 2013-05-29: qty 2

## 2013-05-29 MED ORDER — BUPIVACAINE LIPOSOME 1.3 % IJ SUSP
20.0000 mL | Freq: Once | INTRAMUSCULAR | Status: DC
  Filled 2013-05-29: qty 20

## 2013-05-29 MED ORDER — TRAZODONE HCL 50 MG PO TABS
50.0000 mg | ORAL_TABLET | Freq: Every day | ORAL | Status: DC
  Administered 2013-05-29: 50 mg via ORAL
  Filled 2013-05-29 (×2): qty 1

## 2013-05-29 MED ORDER — OXYCODONE HCL 5 MG PO TABS
5.0000 mg | ORAL_TABLET | ORAL | Status: DC | PRN
  Administered 2013-05-29 (×2): 5 mg via ORAL
  Administered 2013-05-29 – 2013-05-30 (×5): 10 mg via ORAL
  Filled 2013-05-29: qty 2
  Filled 2013-05-29: qty 1
  Filled 2013-05-29 (×2): qty 2
  Filled 2013-05-29: qty 1
  Filled 2013-05-29 (×2): qty 2

## 2013-05-29 MED ORDER — PROMETHAZINE HCL 25 MG/ML IJ SOLN: 6.2500 mg | INTRAMUSCULAR | Status: DC | PRN

## 2013-05-29 MED ORDER — BUPIVACAINE-EPINEPHRINE 0.25% -1:200000 IJ SOLN
INTRAMUSCULAR | Status: DC | PRN
  Administered 2013-05-29: 25 mL

## 2013-05-29 MED ORDER — BUPIVACAINE IN DEXTROSE 0.75-8.25 % IT SOLN
INTRATHECAL | Status: DC | PRN
  Administered 2013-05-29: 1.6 mL via INTRATHECAL

## 2013-05-29 MED ORDER — CEFAZOLIN SODIUM-DEXTROSE 2-3 GM-% IV SOLR
2.0000 g | INTRAVENOUS | Status: AC
  Administered 2013-05-29: 2 g via INTRAVENOUS

## 2013-05-29 MED ORDER — ONDANSETRON HCL 4 MG/2ML IJ SOLN: 4.0000 mg | Freq: Four times a day (QID) | INTRAMUSCULAR | Status: DC | PRN

## 2013-05-29 MED ORDER — MEPERIDINE HCL 50 MG/ML IJ SOLN: 6.2500 mg | INTRAMUSCULAR | Status: DC | PRN

## 2013-05-29 MED ORDER — ONDANSETRON HCL 4 MG/2ML IJ SOLN
INTRAMUSCULAR | Status: DC | PRN
  Administered 2013-05-29: 4 mg via INTRAVENOUS

## 2013-05-29 MED ORDER — PAROXETINE HCL 10 MG PO TABS
10.0000 mg | ORAL_TABLET | Freq: Every morning | ORAL | Status: DC
  Administered 2013-05-30: 10 mg via ORAL
  Filled 2013-05-29: qty 1

## 2013-05-29 MED ORDER — FERROUS SULFATE 325 (65 FE) MG PO TABS
325.0000 mg | ORAL_TABLET | Freq: Three times a day (TID) | ORAL | Status: DC
  Administered 2013-05-29 – 2013-05-30 (×3): 325 mg via ORAL
  Filled 2013-05-29 (×6): qty 1

## 2013-05-29 MED ORDER — DEXTROSE 5 % IV SOLN
500.0000 mg | Freq: Four times a day (QID) | INTRAVENOUS | Status: DC | PRN
  Administered 2013-05-29: 500 mg via INTRAVENOUS
  Filled 2013-05-29: qty 5

## 2013-05-29 MED ORDER — DEXAMETHASONE SODIUM PHOSPHATE 10 MG/ML IJ SOLN
10.0000 mg | Freq: Once | INTRAMUSCULAR | Status: AC
  Administered 2013-05-29: 10 mg via INTRAVENOUS

## 2013-05-29 MED ORDER — CLONAZEPAM 0.5 MG PO TABS: 0.5000 mg | ORAL_TABLET | Freq: Every day | ORAL | Status: DC | PRN

## 2013-05-29 MED ORDER — DOCUSATE SODIUM 100 MG PO CAPS
100.0000 mg | ORAL_CAPSULE | Freq: Two times a day (BID) | ORAL | Status: DC
  Administered 2013-05-29 – 2013-05-30 (×2): 100 mg via ORAL

## 2013-05-29 MED ORDER — TAMSULOSIN HCL 0.4 MG PO CAPS
0.4000 mg | ORAL_CAPSULE | Freq: Every day | ORAL | Status: DC
  Administered 2013-05-29: 0.4 mg via ORAL
  Filled 2013-05-29 (×2): qty 1

## 2013-05-29 MED ORDER — LIDOCAINE HCL (CARDIAC) 20 MG/ML IV SOLN
INTRAVENOUS | Status: DC | PRN
  Administered 2013-05-29: 100 mg via INTRAVENOUS

## 2013-05-29 MED ORDER — ASPIRIN EC 325 MG PO TBEC
325.0000 mg | DELAYED_RELEASE_TABLET | Freq: Two times a day (BID) | ORAL | Status: DC
  Administered 2013-05-29 – 2013-05-30 (×2): 325 mg via ORAL
  Filled 2013-05-29 (×5): qty 1

## 2013-05-29 MED ORDER — FENTANYL CITRATE 0.05 MG/ML IJ SOLN: 25.0000 ug | INTRAMUSCULAR | Status: DC | PRN

## 2013-05-29 MED ORDER — CEFAZOLIN SODIUM-DEXTROSE 2-3 GM-% IV SOLR: 2.0000 g | INTRAVENOUS | Status: DC

## 2013-05-29 MED ORDER — TRANEXAMIC ACID 100 MG/ML IV SOLN
1000.0000 mg | Freq: Once | INTRAVENOUS | Status: AC
  Administered 2013-05-29: 1000 mg via INTRAVENOUS
  Filled 2013-05-29: qty 10

## 2013-05-29 MED ORDER — CHLORHEXIDINE GLUCONATE 4 % EX LIQD
60.0000 mL | Freq: Once | CUTANEOUS | Status: DC
  Filled 2013-05-29: qty 60

## 2013-05-29 MED ORDER — ONDANSETRON HCL 4 MG PO TABS
4.0000 mg | ORAL_TABLET | Freq: Four times a day (QID) | ORAL | Status: DC | PRN
  Administered 2013-05-29: 4 mg via ORAL
  Filled 2013-05-29: qty 1

## 2013-05-29 MED ORDER — SENNA 8.6 MG PO TABS
1.0000 | ORAL_TABLET | Freq: Two times a day (BID) | ORAL | Status: DC
  Administered 2013-05-29 – 2013-05-30 (×2): 8.6 mg via ORAL
  Filled 2013-05-29 (×2): qty 1

## 2013-05-29 MED ORDER — SODIUM CHLORIDE 0.9 % IR SOLN
Status: DC | PRN
  Administered 2013-05-29: 1000 mL

## 2013-05-29 MED ORDER — LACTATED RINGERS IV SOLN
INTRAVENOUS | Status: DC
  Administered 2013-05-29: 1000 mL via INTRAVENOUS

## 2013-05-29 MED ORDER — NICOTINE 21 MG/24HR TD PT24
21.0000 mg | MEDICATED_PATCH | Freq: Every day | TRANSDERMAL | Status: DC
  Administered 2013-05-29 – 2013-05-30 (×2): 21 mg via TRANSDERMAL
  Filled 2013-05-29 (×2): qty 1

## 2013-05-29 MED ORDER — DIPHENHYDRAMINE HCL 12.5 MG/5ML PO ELIX: 25.0000 mg | ORAL_SOLUTION | Freq: Four times a day (QID) | ORAL | Status: DC | PRN

## 2013-05-29 MED ORDER — HYDROMORPHONE HCL PF 1 MG/ML IJ SOLN: 0.5000 mg | INTRAMUSCULAR | Status: DC | PRN

## 2013-05-29 MED ORDER — PROPOFOL INFUSION 10 MG/ML OPTIME
INTRAVENOUS | Status: DC | PRN
  Administered 2013-05-29: 140 ug/kg/min via INTRAVENOUS

## 2013-05-29 MED ORDER — CEFAZOLIN SODIUM 1-5 GM-% IV SOLN
1.0000 g | Freq: Four times a day (QID) | INTRAVENOUS | Status: AC
  Administered 2013-05-29 (×2): 1 g via INTRAVENOUS
  Filled 2013-05-29 (×2): qty 50

## 2013-05-29 MED ORDER — 0.9 % SODIUM CHLORIDE (POUR BTL) OPTIME
TOPICAL | Status: DC | PRN
  Administered 2013-05-29: 1000 mL

## 2013-05-29 MED ORDER — ALUM & MAG HYDROXIDE-SIMETH 200-200-20 MG/5ML PO SUSP: 30.0000 mL | ORAL | Status: DC | PRN

## 2013-05-29 MED ORDER — POLYETHYLENE GLYCOL 3350 17 G PO PACK: 17.0000 g | PACK | Freq: Every day | ORAL | Status: DC | PRN

## 2013-05-29 MED ORDER — SODIUM CHLORIDE 0.9 % IJ SOLN
INTRAMUSCULAR | Status: DC | PRN
  Administered 2013-05-29: 12:00:00

## 2013-05-29 MED ORDER — MENTHOL 3 MG MT LOZG: 1.0000 | LOZENGE | OROMUCOSAL | Status: DC | PRN

## 2013-05-29 MED ORDER — METHOCARBAMOL 500 MG PO TABS
500.0000 mg | ORAL_TABLET | Freq: Four times a day (QID) | ORAL | Status: DC | PRN
  Administered 2013-05-29 – 2013-05-30 (×3): 500 mg via ORAL
  Filled 2013-05-29 (×3): qty 1

## 2013-05-29 MED ORDER — PHENOL 1.4 % MT LIQD: 1.0000 | OROMUCOSAL | Status: DC | PRN

## 2013-05-29 MED ORDER — KETOROLAC TROMETHAMINE 30 MG/ML IJ SOLN
INTRAMUSCULAR | Status: DC | PRN
  Administered 2013-05-29: 30 mg

## 2013-05-29 MED ORDER — POTASSIUM CHLORIDE 2 MEQ/ML IV SOLN
INTRAVENOUS | Status: DC
  Administered 2013-05-29: 16:00:00 via INTRAVENOUS
  Filled 2013-05-29 (×4): qty 1000

## 2013-05-29 SURGICAL SUPPLY — 56 items
BAG ZIPLOCK 12X15 (MISCELLANEOUS) ×2 IMPLANT
BANDAGE ELASTIC 6 VELCRO ST LF (GAUZE/BANDAGES/DRESSINGS) ×2 IMPLANT
BANDAGE ESMARK 6X9 LF (GAUZE/BANDAGES/DRESSINGS) ×1 IMPLANT
BLADE SAW SGTL 13.0X1.19X90.0M (BLADE) ×2 IMPLANT
BNDG ESMARK 6X9 LF (GAUZE/BANDAGES/DRESSINGS) ×2
BOWL SMART MIX CTS (DISPOSABLE) ×2 IMPLANT
CAPT RP KNEE ×2 IMPLANT
CATH FOLEY 2WAY SLVR  5CC 16FR (CATHETERS) ×1
CATH FOLEY 2WAY SLVR 5CC 16FR (CATHETERS) ×1 IMPLANT
CLOTH BEACON ORANGE TIMEOUT ST (SAFETY) ×2 IMPLANT
CUFF TOURN SGL QUICK 34 (TOURNIQUET CUFF) ×1
CUFF TRNQT CYL 34X4X40X1 (TOURNIQUET CUFF) ×1 IMPLANT
DECANTER SPIKE VIAL GLASS SM (MISCELLANEOUS) ×2 IMPLANT
DERMABOND ADVANCED (GAUZE/BANDAGES/DRESSINGS) ×1
DERMABOND ADVANCED .7 DNX12 (GAUZE/BANDAGES/DRESSINGS) ×1 IMPLANT
DRAPE EXTREMITY T 121X128X90 (DRAPE) ×2 IMPLANT
DRAPE POUCH INSTRU U-SHP 10X18 (DRAPES) ×2 IMPLANT
DRAPE U-SHAPE 47X51 STRL (DRAPES) ×2 IMPLANT
DRSG AQUACEL AG ADV 3.5X10 (GAUZE/BANDAGES/DRESSINGS) ×2 IMPLANT
DRSG TEGADERM 4X4.75 (GAUZE/BANDAGES/DRESSINGS) ×2 IMPLANT
DURAPREP 26ML APPLICATOR (WOUND CARE) ×2 IMPLANT
ELECT REM PT RETURN 9FT ADLT (ELECTROSURGICAL) ×2
ELECTRODE REM PT RTRN 9FT ADLT (ELECTROSURGICAL) ×1 IMPLANT
EVACUATOR 1/8 PVC DRAIN (DRAIN) ×2 IMPLANT
FACESHIELD LNG OPTICON STERILE (SAFETY) ×10 IMPLANT
GAUZE SPONGE 2X2 8PLY STRL LF (GAUZE/BANDAGES/DRESSINGS) ×1 IMPLANT
GLOVE BIOGEL PI IND STRL 7.5 (GLOVE) ×1 IMPLANT
GLOVE BIOGEL PI IND STRL 8 (GLOVE) ×1 IMPLANT
GLOVE BIOGEL PI INDICATOR 7.5 (GLOVE) ×1
GLOVE BIOGEL PI INDICATOR 8 (GLOVE) ×1
GLOVE ECLIPSE 8.0 STRL XLNG CF (GLOVE) ×2 IMPLANT
GLOVE ORTHO TXT STRL SZ7.5 (GLOVE) ×4 IMPLANT
GOWN BRE IMP PREV XXLGXLNG (GOWN DISPOSABLE) ×2 IMPLANT
GOWN STRL NON-REIN LRG LVL3 (GOWN DISPOSABLE) ×2 IMPLANT
HANDPIECE INTERPULSE COAX TIP (DISPOSABLE) ×1
KIT BASIN OR (CUSTOM PROCEDURE TRAY) ×2 IMPLANT
MANIFOLD NEPTUNE II (INSTRUMENTS) ×2 IMPLANT
NDL SAFETY ECLIPSE 18X1.5 (NEEDLE) ×1 IMPLANT
NEEDLE HYPO 18GX1.5 SHARP (NEEDLE) ×1
NS IRRIG 1000ML POUR BTL (IV SOLUTION) ×2 IMPLANT
PACK TOTAL JOINT (CUSTOM PROCEDURE TRAY) ×2 IMPLANT
POSITIONER SURGICAL ARM (MISCELLANEOUS) ×2 IMPLANT
SET HNDPC FAN SPRY TIP SCT (DISPOSABLE) ×1 IMPLANT
SET PAD KNEE POSITIONER (MISCELLANEOUS) ×2 IMPLANT
SPONGE GAUZE 2X2 STER 10/PKG (GAUZE/BANDAGES/DRESSINGS) ×1
SUCTION FRAZIER 12FR DISP (SUCTIONS) ×2 IMPLANT
SUT MNCRL AB 4-0 PS2 18 (SUTURE) ×2 IMPLANT
SUT VIC AB 1 CT1 36 (SUTURE) ×2 IMPLANT
SUT VIC AB 2-0 CT1 27 (SUTURE) ×3
SUT VIC AB 2-0 CT1 TAPERPNT 27 (SUTURE) ×3 IMPLANT
SUT VLOC 180 0 24IN GS25 (SUTURE) ×2 IMPLANT
SYR 50ML LL SCALE MARK (SYRINGE) ×2 IMPLANT
TOWEL OR 17X26 10 PK STRL BLUE (TOWEL DISPOSABLE) ×4 IMPLANT
TRAY FOLEY CATH 14FRSI W/METER (CATHETERS) ×2 IMPLANT
WATER STERILE IRR 1500ML POUR (IV SOLUTION) ×4 IMPLANT
WRAP KNEE MAXI GEL POST OP (GAUZE/BANDAGES/DRESSINGS) ×2 IMPLANT

## 2013-05-29 NOTE — Interval H&P Note (Signed)
History and Physical Interval Note:  05/29/2013 10:11 AM  Nathaniel Lee  has presented today for surgery, with the diagnosis of OA LEFT KNEE   The various methods of treatment have been discussed with the patient and family. After consideration of risks, benefits and other options for treatment, the patient has consented to  Procedure(s): LEFT TOTAL KNEE ARTHROPLASTY (Left) as a surgical intervention .  The patient's history has been reviewed, patient examined, no change in status, stable for surgery.  I have reviewed the patient's chart and labs.  Questions were answered to the patient's satisfaction.     [name]

## 2013-05-29 NOTE — Transfer of Care (Signed)
Immediate Anesthesia Transfer of Care Note  Patient: Nathaniel Lee  Procedure(s) Performed: Procedure(s) (LRB): LEFT TOTAL KNEE ARTHROPLASTY (Left)  Patient Location: PACU  Anesthesia Type: Spinal  Level of Consciousness: sedated, patient cooperative and responds to stimulaton  Airway & Oxygen Therapy: Patient Spontanous Breathing and Patient connected to face mask oxgen  Post-op Assessment: Report given to PACU RN and Post -op Vital signs reviewed and stable  Post vital signs: Reviewed and stable  Complications: No apparent anesthesia complications

## 2013-05-29 NOTE — Anesthesia Procedure Notes (Signed)
Spinal  Patient location during procedure: OR Staffing Anesthesiologist: Cyncere Ruhe Performed by: anesthesiologist  Preanesthetic Checklist Completed: patient identified, site marked, surgical consent, pre-op evaluation, timeout performed, IV checked, risks and benefits discussed and monitors and equipment checked Spinal Block Patient position: sitting Prep: Betadine Patient monitoring: heart rate, continuous pulse ox and blood pressure Approach: right paramedian Location: L3-4 Injection technique: single-shot Needle Needle type: Sprotte  Needle gauge: 24 G Needle length: 9 cm Additional Notes Expiration date of kit checked and confirmed. Patient tolerated procedure well, without complications.     

## 2013-05-29 NOTE — Progress Notes (Signed)
Utilization review completed.  

## 2013-05-29 NOTE — Op Note (Signed)
NAME:  Nathaniel Lee                      MEDICAL RECORD NO.:  [id number]                             FACILITY:  [facility]      PHYSICIAN:  Madlyn Frankel. Charlann Boxer, M.D.  DATE OF BIRTH:  1946-02-28      DATE OF PROCEDURE:  05/29/2013                                     OPERATIVE REPORT         PREOPERATIVE DIAGNOSIS:  Left knee osteoarthritis.      POSTOPERATIVE DIAGNOSIS:  Left knee osteoarthritis.      FINDINGS:  The patient was noted to have complete loss of cartilage and   bone-on-bone arthritis with associated osteophytes in the medial and patellofemoral compartments of   the knee with a significant synovitis and associated effusion and flexion/varus deformity.     PROCEDURE:  Left total knee replacement.      COMPONENTS USED:  DePuy rotating platform posterior stabilized knee   system, a size 4N femur, 4 tibia, 10 mm PS insert, and 41 patellar   button.      SURGEON:  Madlyn Frankel. Charlann Boxer, M.D.      ASSISTANT:  Leilani Able, PA-C.      ANESTHESIA:  Spinal.      SPECIMENS:  None.      COMPLICATION:  None.      DRAINS:  One Hemovac.  EBL: <100cc      TOURNIQUET TIME:   Total Tourniquet Time Documented: Thigh (Left) - 57 minutes Total: Thigh (Left) - 57 minutes  .      The patient was stable to the recovery room.      INDICATION FOR PROCEDURE:  Nathaniel Lee is a 67 y.o. male patient of   mine.  The patient had been seen, evaluated, and treated conservatively in the   office with medication, activity modification, and injections.  The patient had   radiographic changes of bone-on-bone arthritis with endplate sclerosis and osteophytes noted.      The patient failed conservative measures including medication, injections, and activity modification, and at this point was ready for more definitive measures.   Based on the radiographic changes and failed conservative measures, the patient   decided to proceed with total knee replacement.  Risks of infection,   DVT, component failure,  need for revision surgery, postop course, and   expectations were all   discussed and reviewed.  Consent was obtained for benefit of pain   relief.      PROCEDURE IN DETAIL:  The patient was brought to the operative theater.   Once adequate anesthesia, preoperative antibiotics, 2 gm of Ancef administered, the patient was positioned supine with the left thigh tourniquet placed.  The  left lower extremity was prepped and draped in sterile fashion.  A time-   out was performed identifying the patient, planned procedure, and   extremity.      The left lower extremity was placed in the [facility] leg holder.  The leg was   exsanguinated, tourniquet elevated to 250 mmHg.  A midline incision was   made followed by median parapatellar arthrotomy.  Following initial   exposure,  attention was first directed to the patella.  Precut   measurement was noted to be 26 mm.  I resected down to 15 mm and used a   41 patellar button to restore patellar height as well as cover the cut   surface.      The lug holes were drilled and a metal shim was placed to protect the   patella from retractors and saw blades.      At this point, attention was now directed to the femur.  The femoral   canal was opened with a drill, irrigated to try to prevent fat emboli.  An   intramedullary rod was passed at 3 degrees valgus, 11mm of bone was   resected off the distal femur due to pre-operative flexion contracture.  Following this resection, the tibia was   subluxated anteriorly.  Using the extramedullary guide, 10 mm of bone was resected off   the proximal lateral tibia.  We confirmed the gap would be   stable medially and laterally with a 10 mm insert as well as confirmed   the cut was perpendicular in the coronal plane, checking with an alignment rod.      Once this was done, I sized the femur to be a size 4 in the anterior-   posterior dimension, chose a narrow component based on medial and   lateral dimension.  The  size 4 rotation block was then pinned in   position anterior referenced using the C-clamp to set rotation.  The   anterior, posterior, and  chamfer cuts were made without difficulty nor   notching making certain that I was along the anterior cortex to help   with flexion gap stability.      The final box cut was made off the lateral aspect of distal femur.      At this point, the tibia was sized to be a size 4, the size 4 tray was   then pinned in position through the medial third of the tubercle,   drilled, and keel punched.  Trial reduction was now carried with a 4N femur,  4 tibia, a 10 mm insert, and the 41 patella botton.  The knee was brought to   extension, full extension with good flexion stability with the patella   tracking through the trochlea without application of pressure.  Given   all these findings, the trial components removed.  Final components were   opened and cement was mixed.  The knee was irrigated with normal saline   solution and pulse lavage.  The synovial lining was   then injected with 0.25% Marcaine with epinephrine and 1 cc of Toradol,   total of 61 cc.      The knee was irrigated.  Final implants were then cemented onto clean and   dried cut surfaces of bone with the knee brought to extension with a 10 mm trial insert.      Once the cement had fully cured, the excess cement was removed   throughout the knee.  I confirmed I was satisfied with the range of   motion and stability, and the final 10 mm PS insert was chosen.  It was   placed into the knee.      The tourniquet had been let down at 56 minutes.  No significant   hemostasis required.  The medium Hemovac drain was placed deep.  The   extensor mechanism was then reapproximated using #1 Vicryl with the knee  in flexion.  The   remaining wound was closed with 2-0 Vicryl and running 4-0 Monocryl.   The knee was cleaned, dried, dressed sterilely using Dermabond and   Aquacel dressing.  Drain site  dressed separately.  The patient was then   brought to recovery room in stable condition, tolerating the procedure   well.   Please note that Physician Assistant, Leilani Able, was present for the entirety of the case, and was utilized for pre-operative positioning, peri-operative retractor management, general facilitation of the procedure.  He was also utilized for primary wound closure at the end of the case.              Madlyn Frankel Charlann Boxer, M.D.

## 2013-05-29 NOTE — Anesthesia Postprocedure Evaluation (Signed)
  Anesthesia Post-op Note  Patient: Nathaniel Lee  Procedure(s) Performed: Procedure(s) (LRB): LEFT TOTAL KNEE ARTHROPLASTY (Left)  Patient Location: PACU  Anesthesia Type: Spinal  Level of Consciousness: awake and alert   Airway and Oxygen Therapy: Patient Spontanous Breathing  Post-op Pain: mild  Post-op Assessment: Post-op Vital signs reviewed, Patient's Cardiovascular Status Stable, Respiratory Function Stable, Patent Airway and No signs of Nausea or vomiting  Last Vitals:  Filed Vitals:   05/29/13 1330  BP: 121/61  Pulse: 64  Temp:   Resp: 18    Post-op Vital Signs: stable   Complications: No apparent anesthesia complications

## 2013-05-29 NOTE — Progress Notes (Signed)
PACU note : charted on wrong chart

## 2013-05-29 NOTE — Preoperative (Signed)
Beta Blockers   Reason not to administer Beta Blockers:Not Applicable 

## 2013-05-29 NOTE — Progress Notes (Signed)
O.K. To go to floor with spinal level of T12- per Dr. Acey Lav.

## 2013-05-29 NOTE — Plan of Care (Signed)
Problem: Consults Goal: Diagnosis- Total Joint Replacement Primary Total Knee     

## 2013-05-29 NOTE — Anesthesia Preprocedure Evaluation (Signed)
Anesthesia Evaluation  Patient identified by MRN, date of birth, ID band Patient awake    Reviewed: Allergy & Precautions, H&P , NPO status , Patient's Chart, lab work & pertinent test results  Airway Mallampati: II TM Distance: >3 FB Neck ROM: Full    Dental no notable dental hx.    Pulmonary neg pulmonary ROS, former smoker,  breath sounds clear to auscultation  Pulmonary exam normal       Cardiovascular hypertension, Pt. on medications Rhythm:Regular Rate:Normal     Neuro/Psych negative neurological ROS  negative psych ROS   GI/Hepatic negative GI ROS, Neg liver ROS,   Endo/Other  negative endocrine ROS  Renal/GU negative Renal ROS  negative genitourinary   Musculoskeletal negative musculoskeletal ROS (+)   Abdominal   Peds negative pediatric ROS (+)  Hematology negative hematology ROS (+)   Anesthesia Other Findings   Reproductive/Obstetrics negative OB ROS                          Anesthesia Physical Anesthesia Plan  ASA: II  Anesthesia Plan: Spinal   Post-op Pain Management:    Induction:   Airway Management Planned: Simple Face Mask  Additional Equipment:   Intra-op Plan:   Post-operative Plan:   Informed Consent: I have reviewed the patients History and Physical, chart, labs and discussed the procedure including the risks, benefits and alternatives for the proposed anesthesia with the patient or authorized representative who has indicated his/her understanding and acceptance.   Dental advisory given  Plan Discussed with: CRNA  Anesthesia Plan Comments:         Anesthesia Quick Evaluation  

## 2013-05-30 ENCOUNTER — Encounter (HOSPITAL_COMMUNITY): Payer: Self-pay | Admitting: Orthopedic Surgery

## 2013-05-30 DIAGNOSIS — E663 Overweight: Secondary | ICD-10-CM

## 2013-05-30 DIAGNOSIS — Z96659 Presence of unspecified artificial knee joint: Secondary | ICD-10-CM

## 2013-05-30 DIAGNOSIS — D500 Iron deficiency anemia secondary to blood loss (chronic): Secondary | ICD-10-CM | POA: Diagnosis not present

## 2013-05-30 LAB — BASIC METABOLIC PANEL
BUN: 15 mg/dL (ref 6–23)
GFR calc non Af Amer: 76 mL/min — ABNORMAL LOW (ref >90)
Glucose, Bld: 136 mg/dL — ABNORMAL HIGH (ref 70–99)
Potassium: 4.6 mEq/L (ref 3.5–5.1)

## 2013-05-30 LAB — CBC
HCT: 43.4 % (ref 39.0–52.0)
Hemoglobin: 14.7 g/dL (ref 13.0–17.0)
MCH: 32.2 pg (ref 26.0–34.0)
MCHC: 33.9 g/dL (ref 30.0–36.0)

## 2013-05-30 MED ORDER — POLYETHYLENE GLYCOL 3350 17 G PO PACK: 17.0000 g | PACK | Freq: Every day | ORAL | Status: DC | PRN

## 2013-05-30 MED ORDER — FERROUS SULFATE 325 (65 FE) MG PO TABS: 325.0000 mg | ORAL_TABLET | Freq: Three times a day (TID) | ORAL | Status: DC

## 2013-05-30 MED ORDER — TIZANIDINE HCL 4 MG PO CAPS: 4.0000 mg | ORAL_CAPSULE | Freq: Three times a day (TID) | ORAL | Status: DC | PRN

## 2013-05-30 MED ORDER — ASPIRIN 325 MG PO TBEC: 325.0000 mg | DELAYED_RELEASE_TABLET | Freq: Two times a day (BID) | ORAL | Status: AC

## 2013-05-30 MED ORDER — OXYCODONE HCL 5 MG PO TABS: 5.0000 mg | ORAL_TABLET | ORAL | Status: DC | PRN

## 2013-05-30 MED ORDER — DSS 100 MG PO CAPS: 100.0000 mg | ORAL_CAPSULE | Freq: Two times a day (BID) | ORAL | Status: DC

## 2013-05-30 NOTE — Evaluation (Signed)
Physical Therapy Evaluation Patient Details Name: Nathaniel Lee MRN: [id number] DOB: May 09, 1946 Today's Date: 05/30/2013 Time: 8657-8469 PT Time Calculation (min): 22 min  PT Assessment / Plan / Recommendation History of Present Illness  S/p L TKA 7/2.   Clinical Impression  On eval, pt required Min assist for mobility-able to ambulate ~70 feet with RW. Anticipate pt will progress well during stay. Recommend hhpt.     PT Assessment  Patient needs continued PT services    Follow Up Recommendations  Home health PT    Does the patient have the potential to tolerate intense rehabilitation      Barriers to Discharge        Equipment Recommendations  Rolling walker with 5" wheels    Recommendations for Other Services OT consult   Frequency 7X/week    Precautions / Restrictions Precautions Precautions: Fall Restrictions Weight Bearing Restrictions: No LLE Weight Bearing: Weight bearing as tolerated   Pertinent Vitals/Pain 3/10 L knee      Mobility  Bed Mobility Bed Mobility: Supine to Sit Supine to Sit: 4: Min guard;HOB elevated Details for Bed Mobility Assistance: VCS safety, technique, hand placement.  Transfers Transfers: Sit to Stand;Stand to Sit Sit to Stand: 4: Min assist;From bed Stand to Sit: 4: Min guard;To chair/3-in-1;With armrests Details for Transfer Assistance: VCs safety, technique, hand placement. Assist to stabilize in standing. Pt sat quickly before safely positioned in front of chair.  Ambulation/Gait Ambulation/Gait Assistance: 4: Min assist Ambulation Distance (Feet): 70 Feet Assistive device: Rolling walker Ambulation/Gait Assistance Details: VCs safety, technique, sequence. Assist to stabilize intermittently.  Gait Pattern: Step-through pattern;Decreased stride length;Decreased step length - left;Trunk flexed    Exercises     PT Diagnosis: Difficulty walking;Abnormality of gait;Acute pain  PT Problem List: Decreased strength;Decreased range of  motion;Decreased activity tolerance;Decreased mobility;Decreased knowledge of use of DME;Pain PT Treatment Interventions: DME instruction;Gait training;Stair training;Functional mobility training;Therapeutic activities;Therapeutic exercise;Patient/family education     PT Goals(Current goals can be found in the care plan section) Acute Rehab PT Goals Patient Stated Goal: regain independence. improve mobility PT Goal Formulation: With patient Time For Goal Achievement: 06/06/13 Potential to Achieve Goals: Good  Visit Information  Last PT Received On: 05/30/13 Assistance Needed: +1 History of Present Illness: S/p L TKA 7/2.        Prior Functioning  Home Living Family/patient expects to be discharged to:: Private residence Living Arrangements: Spouse/significant other Available Help at Discharge: Family Type of Home: House Home Access: Stairs to enter Secretary/administrator of Steps: 1 Entrance Stairs-Rails: None Home Layout: One level Home Equipment: None Prior Function Level of Independence: Independent Communication Communication: HOH    Cognition  Cognition Arousal/Alertness: Awake/alert Behavior During Therapy: WFL for tasks assessed/performed Overall Cognitive Status: Within Functional Limits for tasks assessed    Extremity/Trunk Assessment Upper Extremity Assessment Upper Extremity Assessment: Defer to OT evaluation Lower Extremity Assessment Lower Extremity Assessment: LLE deficits/detail LLE Deficits / Details: hip flex 3/5, hip abd/add at least 3/5, moves ankle well. Pt reports painful 2nd/3rd toes.  Cervical / Trunk Assessment Cervical / Trunk Assessment: Normal   Balance    End of Session PT - End of Session Equipment Utilized During Treatment: Gait belt Activity Tolerance: Patient tolerated treatment well Patient left: in chair;with call bell/phone within reach  GP     Rebeca Alert, MPT Pager: [phone]

## 2013-05-30 NOTE — Progress Notes (Signed)
Physical Therapy Treatment Patient Details Name: Nathaniel Lee MRN: [id number] DOB: Sep 06, 1946 Today's Date: 05/30/2013 Time: 4098-1191 PT Time Calculation (min): 27 min  PT Assessment / Plan / Recommendation  PT Comments   Progressing well. Pt states he plans to d/c home today. All education completed.Ready to d/c home from PT standpoint.   Follow Up Recommendations  Home health PT     Does the patient have the potential to tolerate intense rehabilitation     Barriers to Discharge        Equipment Recommendations  Rolling walker with 5" wheels    Recommendations for Other Services OT consult  Frequency 7X/week   Progress towards PT Goals Progress towards PT goals: Progressing toward goals  Plan Current plan remains appropriate    Precautions / Restrictions Precautions Precautions: Fall Restrictions Weight Bearing Restrictions: No LLE Weight Bearing: Weight bearing as tolerated   Pertinent Vitals/Pain 4/10 L knee     Mobility  Bed Mobility Bed Mobility: Not assessed Transfers Transfers: Sit to Stand;Stand to Sit Sit to Stand: 4: Min guard;From chair/3-in-1 Stand to Sit: 4: Min guard;To chair/3-in-1 Details for Transfer Assistance: VCS safety, L LE positioning before sitting.  Ambulation/Gait Ambulation/Gait Assistance: 4: Min guard Ambulation Distance (Feet): 110 Feet Assistive device: Rolling walker Stairs: Yes Stairs Assistance: 4: Min assist Stairs Assistance Details (indicate cue type and reason): VCs safety, technique, sequence. Assist to stabilize.  Stair Management Technique: Forwards;With walker Number of Stairs: 1    Exercises Total Joint Exercises Ankle Circles/Pumps: AROM;Both;15 reps;Seated Quad Sets: AROM;Both;10 reps;Seated Straight Leg Raises: AROM;Left;10 reps;Seated Long Arc Quad: AROM;Left;10 reps;Seated Knee Flexion: AROM;Left;10 reps;Seated Goniometric ROM: 5-100 degrees   PT Diagnosis:    PT Problem List:   PT Treatment Interventions:      PT Goals (current goals can now be found in the care plan section)    Visit Information  Last PT Received On: 05/30/13 Assistance Needed: +1 History of Present Illness: S/p L TKA 7/1.     Subjective Data      Cognition  Cognition Arousal/Alertness: Awake/alert Behavior During Therapy: WFL for tasks assessed/performed Overall Cognitive Status: Within Functional Limits for tasks assessed    Balance     End of Session PT - End of Session Equipment Utilized During Treatment: Gait belt Activity Tolerance: Patient tolerated treatment well Patient left: in chair;with call bell/phone within reach   GP     Rebeca Alert, MPT Pager: [phone]

## 2013-05-30 NOTE — Plan of Care (Signed)
Problem: Consults Goal: Diagnosis- Total Joint Replacement Outcome: Completed/Met Date Met:  05/30/13 Primary Total Knee LEFT

## 2013-05-30 NOTE — Progress Notes (Signed)
OT Cancellation Note  Patient Details Name: Nathaniel Lee MRN: [id number] DOB: 1946/01/14   Cancelled Treatment:    Reason Eval/Treat Not Completed: OT screened, no needs identified, will sign off  3:1 was delivered to room and pt plans to sponge bathe initially.  Wife will help with adls.    Brinlyn Cena 05/30/2013, 12:54 PM Marica Otter, OTR/L [phone] 05/30/2013

## 2013-05-30 NOTE — Progress Notes (Signed)
   Subjective: 1 Day Post-Op Procedure(s) (LRB): LEFT TOTAL KNEE ARTHROPLASTY (Left)   Patient reports pain as mild, pain well controlled. No events throughout the night. Ready to be discharged home.  Objective:   VITALS:   Filed Vitals:   05/30/13 0952  BP: 135/62  Pulse: 70  Temp: 98.4 F (36.9 C)  Resp: 16    Neurovascular intact Dorsiflexion/Plantar flexion intact Incision: dressing C/D/I No cellulitis present Compartment soft  LABS  Recent Labs  05/30/13 0530  HGB 14.7  HCT 43.4  WBC 13.5*  PLT 168     Recent Labs  05/30/13 0530  NA 138  K 4.6  BUN 15  CREATININE 1.00  GLUCOSE 136*     Assessment/Plan: 1 Day Post-Op Procedure(s) (LRB): LEFT TOTAL KNEE ARTHROPLASTY (Left) HV drain d/c'ed FOley cath d/c'ed Advance diet Up with therapy D/C IV fluids Discharge home with home health Follow up in 2 weeks at [facility]. Follow up with OLIN,Adelene Polivka D in 2 weeks.  Contact information:  [facility] [address], [address] [place] [address] [phone]    Expected ABLA  Treated with iron and will observe  Overweight (BMI 25-29.9) Estimated body mass index is 28.46 kg/(m^2) as calculated from the following:   Height as of this encounter: 5\' 5"  (1.651 m).   Weight as of this encounter: 77.565 kg (171 lb). Patient also counseled that weight may inhibit the healing process Patient counseled that losing weight will help with future health issues       Anastasio Auerbach. Abimbola Aki   PAC  05/30/2013, 11:49 AM

## 2013-05-30 NOTE — Progress Notes (Signed)
[facility]   [facility] is providing the following service:  RW and Commode (will be delivered to patients room prior to d/c)  If patient discharges after hours, please call [phone].   Nathaniel Lee [phone] 05/30/2013, 7:08 AM

## 2013-05-31 DIAGNOSIS — I10 Essential (primary) hypertension: Secondary | ICD-10-CM | POA: Diagnosis not present

## 2013-05-31 DIAGNOSIS — IMO0001 Reserved for inherently not codable concepts without codable children: Secondary | ICD-10-CM | POA: Diagnosis not present

## 2013-05-31 DIAGNOSIS — Z96659 Presence of unspecified artificial knee joint: Secondary | ICD-10-CM | POA: Diagnosis not present

## 2013-05-31 DIAGNOSIS — Z471 Aftercare following joint replacement surgery: Secondary | ICD-10-CM | POA: Diagnosis not present

## 2013-05-31 NOTE — Care Management Note (Signed)
    Page 1 of 2   05/31/2013     12:24:45 PM   CARE MANAGEMENT NOTE 05/31/2013  Patient:  Nathaniel Lee, Nathaniel Lee   Account Number:  [id number]  Date Initiated:  05/30/2013  Documentation initiated by:  Colleen Can  Subjective/Objective Assessment:   dx total left knee arthroplasty on day of admission.    Genevieve Norlander will start services within 48hrs of discharge.     Action/Plan:   CM spoke with patient. Plans are for patient to return to her home in [place] [facility] where friend will be caregiver. Will need dme and HH services .   Anticipated DC Date:  05/30/2013   Anticipated DC Plan:  HOME W HOME HEALTH SERVICES      DC Planning Services  CM consult      PAC Choice  DURABLE MEDICAL EQUIPMENT  HOME HEALTH   Choice offered to / List presented to:  C-1 Patient   DME arranged  3-N-1  Levan Hurst      DME agency  [facility]     HH arranged  HH-2 PT      [facility] agency  [facility]   Status of service:  Completed, signed off Medicare Important Message given?  NA - LOS <3 / Initial given by admissions (If response is "NO", the following Medicare IM given date fields will be blank) Date Medicare IM given:   Date Additional Medicare IM given:    Discharge Disposition:  HOME W HOME HEALTH SERVICES  Per UR Regulation:    If discussed at Long Length of Stay Meetings, dates discussed:    Comments:

## 2013-06-01 DIAGNOSIS — Z471 Aftercare following joint replacement surgery: Secondary | ICD-10-CM | POA: Diagnosis not present

## 2013-06-01 DIAGNOSIS — I10 Essential (primary) hypertension: Secondary | ICD-10-CM | POA: Diagnosis not present

## 2013-06-01 DIAGNOSIS — Z96659 Presence of unspecified artificial knee joint: Secondary | ICD-10-CM | POA: Diagnosis not present

## 2013-06-01 DIAGNOSIS — IMO0001 Reserved for inherently not codable concepts without codable children: Secondary | ICD-10-CM | POA: Diagnosis not present

## 2013-06-04 DIAGNOSIS — Z96659 Presence of unspecified artificial knee joint: Secondary | ICD-10-CM | POA: Diagnosis not present

## 2013-06-04 DIAGNOSIS — Z471 Aftercare following joint replacement surgery: Secondary | ICD-10-CM | POA: Diagnosis not present

## 2013-06-04 DIAGNOSIS — I10 Essential (primary) hypertension: Secondary | ICD-10-CM | POA: Diagnosis not present

## 2013-06-04 DIAGNOSIS — IMO0001 Reserved for inherently not codable concepts without codable children: Secondary | ICD-10-CM | POA: Diagnosis not present

## 2013-06-05 DIAGNOSIS — I10 Essential (primary) hypertension: Secondary | ICD-10-CM | POA: Diagnosis not present

## 2013-06-05 DIAGNOSIS — IMO0001 Reserved for inherently not codable concepts without codable children: Secondary | ICD-10-CM | POA: Diagnosis not present

## 2013-06-05 DIAGNOSIS — Z471 Aftercare following joint replacement surgery: Secondary | ICD-10-CM | POA: Diagnosis not present

## 2013-06-05 DIAGNOSIS — Z96659 Presence of unspecified artificial knee joint: Secondary | ICD-10-CM | POA: Diagnosis not present

## 2013-06-05 NOTE — Discharge Summary (Signed)
Physician Discharge Summary  Patient ID: Nathaniel Lee MRN: [id number] DOB/AGE: [birth year] 67 y.o.  Admit date: 05/29/2013 Discharge date: 05/30/2013   Procedures:  Procedure(s) (LRB): LEFT TOTAL KNEE ARTHROPLASTY (Left)  Attending Physician:  Dr. Durene Romans   Admission Diagnoses:   Left knee OA / pain  Discharge Diagnoses:  Principal Problem:   S/P left TKA Active Problems:   Expected blood loss anemia   Overweight (BMI 25.0-29.9)  Past Medical History  Diagnosis Date  . Arthritis     R shoulder, both knees  . Depression   . Anxiety     h/o panic attacks - related to anxiety   . Hypertension     ekg- last one done 2010 at Dr. Jearl Klinefelter office- [place] , sees Dr. Tomasa Blase- Rosalita Levan now for PCP, [facility]     HPI: Nathaniel Lee, 67 y.o. male, has a history of pain and functional disability in the left knee due to arthritis and has failed non-surgical conservative treatments for greater than 12 weeks to includeNSAID's and/or analgesics, corticosteriod injections, viscosupplementation injections, use of assistive devices and activity modification. Onset of symptoms was gradual, starting 8 years ago with gradually worsening course since that time. The patient noted no past surgery on the left knee(s). Patient currently rates pain in the left knee(s) at 9 out of 10 with activity. Patient has night pain, worsening of pain with activity and weight bearing, pain that interferes with activities of daily living, pain with passive range of motion, crepitus and joint swelling. Patient has evidence of periarticular osteophytes and joint space narrowing by imaging studies. There is no active signs of infection. Risks, benefits and expectations were discussed with the patient. Patient understand the risks, benefits and expectations and wishes to proceed with surgery.   PCP: Noni Saupe., MD   Discharged Condition: good  Hospital Course:  Patient underwent the above stated  procedure on 05/29/2013. Patient tolerated the procedure well and brought to the recovery room in good condition and subsequently to the floor.  POD #1 BP: 135/62 ; Pulse: 70 ; Temp: 98.4 F (36.9 C) ; Resp: 16 Pt's foley was removed, as well as the hemovac drain removed. IV was changed to a saline lock. Patient reports pain as mild, pain well controlled. No events throughout the night. Ready to be discharged home. Neurovascular intact, dorsiflexion/plantar flexion intact, incision: dressing C/D/I, no cellulitis present and compartment soft.   LABS  Basename  05/30/13    0530   HGB  14.7  HCT  43.4    Discharge Exam: General appearance: alert, cooperative and no distress Extremities: Homans sign is negative, no sign of DVT, no edema, redness or tenderness in the calves or thighs and no ulcers, gangrene or trophic changes  Disposition:   Home-Health Care Svc with follow up in 2 weeks   Follow-up Information   Follow up with [name], MD. Schedule an appointment as soon as possible for a visit in 2 weeks.   Contact information:   [address] [place] [address] [place] [address] [phone]       Discharge Orders   Future Orders Complete By Expires     Call MD / Call 911  As directed     Comments:      If you experience chest pain or shortness of breath, CALL 911 and be transported to the hospital emergency room.  If you develope a fever above 101 F, pus (white drainage) or increased drainage or  redness at the wound, or calf pain, call your surgeon's office.    Change dressing  As directed     Comments:      Maintain surgical dressing for 10-14 days, then change the dressing daily with sterile 4 x 4 inch gauze dressing and tape. Keep the area dry and clean.    Constipation Prevention  As directed     Comments:      Drink plenty of fluids.  Prune juice may be helpful.  You may use a stool softener, such as Colace (over the counter) 100 mg twice a day.  Use MiraLax (over the  counter) for constipation as needed.    Diet - low sodium heart healthy  As directed     Discharge instructions  As directed     Comments:      Maintain surgical dressing for 10-14 days, then replace with gauze and tape. Keep the area dry and clean until follow up. Follow up in 2 weeks at [facility]. Call with any questions or concerns.    Increase activity slowly as tolerated  As directed     TED hose  As directed     Comments:      Use stockings (TED hose) for 2 weeks on both leg(s).  You may remove them at night for sleeping.    Weight bearing as tolerated  As directed          Medication List    STOP taking these medications       oxyCODONE-acetaminophen 10-325 MG per tablet  Commonly known as:  PERCOCET      TAKE these medications       aspirin 325 MG EC tablet  Take 1 tablet (325 mg total) by mouth 2 (two) times daily.     clonazePAM 0.5 MG tablet  Commonly known as:  KLONOPIN  Take 0.5 mg by mouth daily as needed (For acute stress.).     DSS 100 MG Caps  Take 100 mg by mouth 2 (two) times daily.     ferrous sulfate 325 (65 FE) MG tablet  Take 1 tablet (325 mg total) by mouth 3 (three) times daily after meals.     hydrocortisone-pramoxine 2.5-1 % rectal cream  Commonly known as:  ANALPRAM-HC  Place 1 application rectally daily as needed for hemorrhoids.     lisinopril 40 MG tablet  Commonly known as:  PRINIVIL,ZESTRIL  Take 40 mg by mouth at bedtime.     multivitamin with minerals Tabs  Take 1 tablet by mouth every morning.     niacin 500 MG tablet  Take 500 mg by mouth daily with breakfast.     oxyCODONE 5 MG immediate release tablet  Commonly known as:  Oxy IR/ROXICODONE  Take 1-2 tablets (5-10 mg total) by mouth every 3 (three) hours as needed for pain.     PARoxetine 40 MG tablet  Commonly known as:  PAXIL  Take 40 mg by mouth every morning. He takes with a 10mg  tablet to equal his dose of 50mg .     PARoxetine 10 MG tablet  Commonly  known as:  PAXIL  Take 10 mg by mouth every morning. He takes with a 40mg  tablet to equal his dose of 50mg .     polyethylene glycol packet  Commonly known as:  MIRALAX / GLYCOLAX  Take 17 g by mouth daily as needed.     pravastatin 20 MG tablet  Commonly known as:  PRAVACHOL  Take 20 mg  by mouth at bedtime.     tamsulosin 0.4 MG Caps  Commonly known as:  FLOMAX  Take 0.4 mg by mouth at bedtime.     testosterone cypionate 200 MG/ML injection  Commonly known as:  DEPOTESTOTERONE CYPIONATE  Inject 200 mg into the muscle every 14 (fourteen) days. He receives at his doctor's office.     tetrahydrozoline 0.05 % ophthalmic solution  Place 1 drop into both eyes daily as needed (For eye redness.).     tiZANidine 4 MG capsule  Commonly known as:  ZANAFLEX  Take 1 capsule (4 mg total) by mouth 3 (three) times daily as needed for muscle spasms.     traZODone 50 MG tablet  Commonly known as:  DESYREL  Take 50 mg by mouth at bedtime.     VITAMIN B 12 PO  Take 1 tablet by mouth every morning.     vitamin C 1000 MG tablet  Take 1,000 mg by mouth every morning.         Signed: Anastasio Auerbach. Arienna Benegas   PAC  06/05/2013, 8:35 AM

## 2013-06-06 DIAGNOSIS — Z96659 Presence of unspecified artificial knee joint: Secondary | ICD-10-CM | POA: Diagnosis not present

## 2013-06-06 DIAGNOSIS — I10 Essential (primary) hypertension: Secondary | ICD-10-CM | POA: Diagnosis not present

## 2013-06-06 DIAGNOSIS — Z471 Aftercare following joint replacement surgery: Secondary | ICD-10-CM | POA: Diagnosis not present

## 2013-06-06 DIAGNOSIS — IMO0001 Reserved for inherently not codable concepts without codable children: Secondary | ICD-10-CM | POA: Diagnosis not present

## 2013-06-07 DIAGNOSIS — IMO0001 Reserved for inherently not codable concepts without codable children: Secondary | ICD-10-CM | POA: Diagnosis not present

## 2013-06-07 DIAGNOSIS — I10 Essential (primary) hypertension: Secondary | ICD-10-CM | POA: Diagnosis not present

## 2013-06-07 DIAGNOSIS — Z471 Aftercare following joint replacement surgery: Secondary | ICD-10-CM | POA: Diagnosis not present

## 2013-06-07 DIAGNOSIS — Z96659 Presence of unspecified artificial knee joint: Secondary | ICD-10-CM | POA: Diagnosis not present

## 2013-06-08 DIAGNOSIS — I10 Essential (primary) hypertension: Secondary | ICD-10-CM | POA: Diagnosis not present

## 2013-06-08 DIAGNOSIS — Z471 Aftercare following joint replacement surgery: Secondary | ICD-10-CM | POA: Diagnosis not present

## 2013-06-08 DIAGNOSIS — Z96659 Presence of unspecified artificial knee joint: Secondary | ICD-10-CM | POA: Diagnosis not present

## 2013-06-08 DIAGNOSIS — IMO0001 Reserved for inherently not codable concepts without codable children: Secondary | ICD-10-CM | POA: Diagnosis not present

## 2013-06-11 DIAGNOSIS — Z471 Aftercare following joint replacement surgery: Secondary | ICD-10-CM | POA: Diagnosis not present

## 2013-06-11 DIAGNOSIS — I10 Essential (primary) hypertension: Secondary | ICD-10-CM | POA: Diagnosis not present

## 2013-06-11 DIAGNOSIS — IMO0001 Reserved for inherently not codable concepts without codable children: Secondary | ICD-10-CM | POA: Diagnosis not present

## 2013-06-11 DIAGNOSIS — Z96659 Presence of unspecified artificial knee joint: Secondary | ICD-10-CM | POA: Diagnosis not present

## 2013-06-12 DIAGNOSIS — IMO0001 Reserved for inherently not codable concepts without codable children: Secondary | ICD-10-CM | POA: Diagnosis not present

## 2013-06-12 DIAGNOSIS — Z96659 Presence of unspecified artificial knee joint: Secondary | ICD-10-CM | POA: Diagnosis not present

## 2013-06-12 DIAGNOSIS — Z471 Aftercare following joint replacement surgery: Secondary | ICD-10-CM | POA: Diagnosis not present

## 2013-06-12 DIAGNOSIS — I10 Essential (primary) hypertension: Secondary | ICD-10-CM | POA: Diagnosis not present

## 2013-06-13 DIAGNOSIS — Z96659 Presence of unspecified artificial knee joint: Secondary | ICD-10-CM | POA: Diagnosis not present

## 2013-06-13 DIAGNOSIS — Z471 Aftercare following joint replacement surgery: Secondary | ICD-10-CM | POA: Diagnosis not present

## 2013-06-13 DIAGNOSIS — IMO0001 Reserved for inherently not codable concepts without codable children: Secondary | ICD-10-CM | POA: Diagnosis not present

## 2013-06-13 DIAGNOSIS — I10 Essential (primary) hypertension: Secondary | ICD-10-CM | POA: Diagnosis not present

## 2013-06-14 DIAGNOSIS — I10 Essential (primary) hypertension: Secondary | ICD-10-CM | POA: Diagnosis not present

## 2013-06-14 DIAGNOSIS — IMO0001 Reserved for inherently not codable concepts without codable children: Secondary | ICD-10-CM | POA: Diagnosis not present

## 2013-06-14 DIAGNOSIS — Z96659 Presence of unspecified artificial knee joint: Secondary | ICD-10-CM | POA: Diagnosis not present

## 2013-06-14 DIAGNOSIS — Z4789 Encounter for other orthopedic aftercare: Secondary | ICD-10-CM | POA: Diagnosis not present

## 2013-06-14 DIAGNOSIS — Z471 Aftercare following joint replacement surgery: Secondary | ICD-10-CM | POA: Diagnosis not present

## 2013-06-15 DIAGNOSIS — Z471 Aftercare following joint replacement surgery: Secondary | ICD-10-CM | POA: Diagnosis not present

## 2013-06-15 DIAGNOSIS — Z96659 Presence of unspecified artificial knee joint: Secondary | ICD-10-CM | POA: Diagnosis not present

## 2013-06-15 DIAGNOSIS — IMO0001 Reserved for inherently not codable concepts without codable children: Secondary | ICD-10-CM | POA: Diagnosis not present

## 2013-06-15 DIAGNOSIS — I10 Essential (primary) hypertension: Secondary | ICD-10-CM | POA: Diagnosis not present

## 2013-06-21 ENCOUNTER — Encounter (HOSPITAL_COMMUNITY): Payer: Self-pay | Admitting: Pharmacy Technician

## 2013-06-21 NOTE — Progress Notes (Signed)
Need orders please - pt coming for preop WED 06/27/13 - thank you

## 2013-06-26 ENCOUNTER — Other Ambulatory Visit (HOSPITAL_COMMUNITY): Payer: Self-pay | Admitting: Orthopedic Surgery

## 2013-06-26 NOTE — Progress Notes (Signed)
ekg and chest 2 view xray epic

## 2013-06-26 NOTE — Patient Instructions (Addendum)
20 Nathaniel Lee  06/26/2013   Your procedure is scheduled on: 07-03-2013  Report to [facility] at 600 AM.  Call this number if you have problems the morning of surgery [phone]   Remember:   Do not eat food or drink liquids :After Midnight.     Take these medicines the morning of surgery with A SIP OF WATER: Paxil                      Do not wear jewelry, make-up or nail polish.  Do not wear lotions, powders, or perfumes. You may wear deodorant.   Men may shave face and neck.  Do not bring valuables to the hospital. [facility] IS NOT RESPONSIBLE FOR VALUEABLES.  Contacts, dentures or bridgework may not be worn into surgery.  Leave suitcase in the car. After surgery it may be brought to your room.  For patients admitted to the hospital, checkout time is 11:00 AM the day of discharge.    Please read over the following fact sheets that you were given: MRSA Information, blood fact sheet, incentive spirometer fact sheet  Call Birdie Sons RN pre op nurse if needed [phone]    FAILURE TO FOLLOW THESE INSTRUCTIONS MAY RESULT IN THE CANCELLATION OF YOUR SURGERY.  PATIENT SIGNATURE___________________________________________  NURSE SIGNATURE_____________________________________________

## 2013-06-27 ENCOUNTER — Encounter (HOSPITAL_COMMUNITY): Payer: Self-pay

## 2013-06-27 ENCOUNTER — Encounter (HOSPITAL_COMMUNITY)
Admission: RE | Admit: 2013-06-27 | Discharge: 2013-06-27 | Disposition: A | Discharge status: Home / Self Care | Payer: Medicare Other | Source: Ambulatory Visit | Attending: Orthopedic Surgery | Admitting: Orthopedic Surgery

## 2013-06-27 ENCOUNTER — Other Ambulatory Visit (HOSPITAL_COMMUNITY): Payer: Medicare Other

## 2013-06-27 DIAGNOSIS — Z01812 Encounter for preprocedural laboratory examination: Secondary | ICD-10-CM | POA: Insufficient documentation

## 2013-06-27 HISTORY — DX: Personal history of other diseases of the respiratory system: Z87.09

## 2013-06-27 HISTORY — DX: Unspecified hearing loss, right ear: H91.91

## 2013-06-27 HISTORY — DX: Pure hypercholesterolemia, unspecified: E78.00

## 2013-06-27 HISTORY — DX: Gastro-esophageal reflux disease without esophagitis: K21.9

## 2013-06-27 LAB — URINALYSIS, ROUTINE W REFLEX MICROSCOPIC
Bilirubin Urine: NEGATIVE
Glucose, UA: NEGATIVE mg/dL
Hgb urine dipstick: NEGATIVE
Ketones, ur: NEGATIVE mg/dL
Protein, ur: NEGATIVE mg/dL

## 2013-06-27 LAB — SURGICAL PCR SCREEN: MRSA, PCR: NEGATIVE

## 2013-06-27 LAB — CBC
MCH: 32.7 pg (ref 26.0–34.0)
MCHC: 34.5 g/dL (ref 30.0–36.0)
MCV: 94.9 fL (ref 78.0–100.0)
Platelets: 203 10*3/uL (ref 150–400)
RDW: 12.4 % (ref 11.5–15.5)

## 2013-06-27 LAB — BASIC METABOLIC PANEL
BUN: 21 mg/dL (ref 6–23)
CO2: 30 mEq/L (ref 19–32)
Calcium: 9.6 mg/dL (ref 8.4–10.5)
Creatinine, Ser: 1.07 mg/dL (ref 0.50–1.35)
GFR calc non Af Amer: 70 mL/min — ABNORMAL LOW (ref >90)
Glucose, Bld: 103 mg/dL — ABNORMAL HIGH (ref 70–99)

## 2013-06-27 NOTE — Progress Notes (Signed)
06/27/13 1337  OBSTRUCTIVE SLEEP APNEA  Have you ever been diagnosed with sleep apnea through a sleep study? No  Do you snore loudly (loud enough to be heard through closed doors)?  1  Do you often feel tired, fatigued, or sleepy during the daytime? 0  Has anyone observed you stop breathing during your sleep? 1  Do you have, or are you being treated for high blood pressure? 1  BMI more than 35 kg/m2? 0  Age over 67 years old? 1  Neck circumference greater than 40 cm/18 inches? 0  Gender: 1  Obstructive Sleep Apnea Score 5  Score 4 or greater  Results sent to PCP

## 2013-06-27 NOTE — H&P (Signed)
TOTAL KNEE ADMISSION H&P  Patient is being admitted for right total knee arthroplasty.   Subjective:  Chief Complaint:  Right knee OA / pain.  HPI: Nathaniel Lee, 67 y.o. male, has a history of pain and functional disability in the right knee due to arthritis and has failed non-surgical conservative treatments for greater than 12 weeks to include NSAID's and/or analgesics, corticosteriod injections, viscosupplementation injections, supervised PT with diminished ADL's post treatment, use of assistive devices and activity modification.  Onset of symptoms was gradual, starting 8 years ago with gradually worsening course since that time. The patient noted prior procedures on the knee to include  arthroplasty on the left knee(s).  Patient currently rates pain in the right knee(s) at 8 out of 10 with activity. Patient has night pain, worsening of pain with activity and weight bearing, pain that interferes with activities of daily living, pain with passive range of motion, crepitus and joint swelling.  Patient has evidence of periarticular osteophytes and joint space narrowing by imaging studies. There is no active infection.  Risks, benefits and expectations were discussed with the patient. Patient understand the risks, benefits and expectations and wishes to proceed with surgery.   D/C Plans:   Home with HHPT  Post-op Meds:    Rx given for ASA, Zanaflex, Iron, Colace and MiraLax  Tranexamic Acid:   To be given  Decadron:    To be given  FYI:    ASA post-op  Oxycodone for pain management   Patient Active Problem List   Diagnosis Date Noted  . S/P left TKA 05/30/2013  . Expected blood loss anemia 05/30/2013  . Overweight (BMI 25.0-29.9) 05/30/2013   Past Medical History  Diagnosis Date  . Arthritis     R shoulder, both knees  . Depression   . Anxiety     h/o panic attacks - related to anxiety   . Hypertension     ekg- last one done 2010 at Dr. Jearl Klinefelter office- [place] , sees Dr. Tomasa Blase-  Rosalita Levan now for PCP, [facility]     Past Surgical History  Procedure Laterality Date  . Carpal tunnel release      L hand - poor results, per pt. - 2012  . Total shoulder arthroplasty  04/13/2012  . Total shoulder arthroplasty  04/13/2012    Procedure: TOTAL SHOULDER ARTHROPLASTY;  Surgeon: Senaida Lange, MD;  Location: [facility] OR;  Service: Orthopedics;  Laterality: Right;  Right total shoulder  . Total knee arthroplasty Left 05/29/2013    Procedure: LEFT TOTAL KNEE ARTHROPLASTY;  Surgeon: [name], MD;  Location: [facility] ORS;  Service: Orthopedics;  Laterality: Left;    No Known Allergies   History  Substance Use Topics  . Smoking status: Current Every Day Smoker -- 1.50 packs/day for 25 years    Types: Cigarettes  . Smokeless tobacco: Never Used  . 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 Anesthesia problems Neg Hx      Review of Systems  Constitutional: Negative.   HENT: Positive for hearing loss.   Eyes: Negative.   Respiratory: Positive for cough.   Cardiovascular: Negative.   Gastrointestinal: Negative.   Genitourinary: Negative.   Musculoskeletal: Positive for joint pain.  Skin: Negative.   Endo/Heme/Allergies: Negative.   Psychiatric/Behavioral: Positive for depression. The patient is nervous/anxious.  Objective:  Physical Exam  Constitutional: He is oriented to person, place, and time. He appears well-developed and well-nourished.  HENT:  Head: Normocephalic and atraumatic.  Mouth/Throat: Oropharynx is clear and moist.  Eyes: Pupils are equal, round, and reactive to light.  Neck: Neck supple.  Cardiovascular: Normal rate, regular rhythm, normal heart sounds and intact distal pulses.   Respiratory: Effort normal and breath sounds normal. No respiratory distress. He has no wheezes.  GI: Soft. There is no tenderness. There is no  guarding.  Musculoskeletal:       Right knee: He exhibits decreased range of motion, swelling, abnormal alignment and bony tenderness. He exhibits no effusion, no ecchymosis, no deformity, no laceration and no erythema. Tenderness found. Medial joint line and lateral joint line tenderness noted.  Neurological: He is alert and oriented to person, place, and time.  Skin: Skin is warm and dry.  Psychiatric: He has a normal mood and affect.    Labs:  Estimated body mass index is 29.34 kg/(m^2) as calculated from the following:   Height as of 04/13/12: 5\' 4"  (1.626 m).   Weight as of 05/29/13: 77.565 kg (171 lb).   Imaging Review Plain radiographs demonstrate severe degenerative joint disease of the right knee(s). The overall alignment is  mild varus. The bone quality appears to be good for age and reported activity level.  Assessment/Plan:  End stage arthritis, right knee   The patient history, physical examination, clinical judgment of the provider and imaging studies are consistent with end stage degenerative joint disease of the right knee(s) and total knee arthroplasty is deemed medically necessary. The treatment options including medical management, injection therapy arthroscopy and arthroplasty were discussed at length. The risks and benefits of total knee arthroplasty were presented and reviewed. The risks due to aseptic loosening, infection, stiffness, patella tracking problems, thromboembolic complications and other imponderables were discussed. The patient acknowledged the explanation, agreed to proceed with the plan and consent was signed. Patient is being admitted for inpatient treatment for surgery, pain control, PT, OT, prophylactic antibiotics, VTE prophylaxis, progressive ambulation and ADL's and discharge planning. The patient is planning to be discharged home with home health services.    Anastasio Auerbach Aleysha Meckler   PAC  06/27/2013, 10:48 AM

## 2013-07-03 ENCOUNTER — Inpatient Hospital Stay (HOSPITAL_COMMUNITY)
Admission: RE | Admit: 2013-07-03 | Discharge: 2013-07-04 | DRG: 470 | Disposition: A | Discharge status: Home Health | Payer: Medicare Other | Source: Ambulatory Visit | Attending: Orthopedic Surgery | Admitting: Orthopedic Surgery

## 2013-07-03 ENCOUNTER — Encounter (HOSPITAL_COMMUNITY)
Admission: RE | Disposition: A | Discharge status: Home Health | Payer: Self-pay | Source: Ambulatory Visit | Attending: Orthopedic Surgery

## 2013-07-03 ENCOUNTER — Encounter (HOSPITAL_COMMUNITY): Payer: Self-pay | Admitting: *Deleted

## 2013-07-03 ENCOUNTER — Inpatient Hospital Stay (HOSPITAL_COMMUNITY): Payer: Medicare Other | Admitting: Anesthesiology

## 2013-07-03 ENCOUNTER — Encounter (HOSPITAL_COMMUNITY): Payer: Self-pay | Admitting: Anesthesiology

## 2013-07-03 DIAGNOSIS — M6580 Other synovitis and tenosynovitis, unspecified site: Secondary | ICD-10-CM | POA: Diagnosis present

## 2013-07-03 DIAGNOSIS — Z01812 Encounter for preprocedural laboratory examination: Secondary | ICD-10-CM | POA: Diagnosis not present

## 2013-07-03 DIAGNOSIS — F329 Major depressive disorder, single episode, unspecified: Secondary | ICD-10-CM | POA: Diagnosis present

## 2013-07-03 DIAGNOSIS — D500 Iron deficiency anemia secondary to blood loss (chronic): Secondary | ICD-10-CM | POA: Diagnosis present

## 2013-07-03 DIAGNOSIS — I10 Essential (primary) hypertension: Secondary | ICD-10-CM | POA: Diagnosis not present

## 2013-07-03 DIAGNOSIS — Z6827 Body mass index (BMI) 27.0-27.9, adult: Secondary | ICD-10-CM | POA: Diagnosis not present

## 2013-07-03 DIAGNOSIS — K219 Gastro-esophageal reflux disease without esophagitis: Secondary | ICD-10-CM | POA: Diagnosis not present

## 2013-07-03 DIAGNOSIS — E663 Overweight: Secondary | ICD-10-CM | POA: Diagnosis present

## 2013-07-03 DIAGNOSIS — IMO0002 Reserved for concepts with insufficient information to code with codable children: Secondary | ICD-10-CM | POA: Diagnosis not present

## 2013-07-03 DIAGNOSIS — F17200 Nicotine dependence, unspecified, uncomplicated: Secondary | ICD-10-CM | POA: Diagnosis present

## 2013-07-03 DIAGNOSIS — Z96651 Presence of right artificial knee joint: Secondary | ICD-10-CM

## 2013-07-03 DIAGNOSIS — Z96659 Presence of unspecified artificial knee joint: Secondary | ICD-10-CM

## 2013-07-03 DIAGNOSIS — F3289 Other specified depressive episodes: Secondary | ICD-10-CM | POA: Diagnosis present

## 2013-07-03 DIAGNOSIS — F411 Generalized anxiety disorder: Secondary | ICD-10-CM | POA: Diagnosis present

## 2013-07-03 DIAGNOSIS — D62 Acute posthemorrhagic anemia: Secondary | ICD-10-CM | POA: Diagnosis not present

## 2013-07-03 DIAGNOSIS — M25569 Pain in unspecified knee: Secondary | ICD-10-CM | POA: Diagnosis not present

## 2013-07-03 DIAGNOSIS — M1710 Unilateral primary osteoarthritis, unspecified knee: Secondary | ICD-10-CM | POA: Diagnosis not present

## 2013-07-03 HISTORY — PX: TOTAL KNEE ARTHROPLASTY: SHX125

## 2013-07-03 LAB — TYPE AND SCREEN: ABO/RH(D): A POS

## 2013-07-03 SURGERY — ARTHROPLASTY, KNEE, TOTAL
Anesthesia: Spinal | Site: Knee | Laterality: Right | Wound class: Clean

## 2013-07-03 MED ORDER — LACTATED RINGERS IV SOLN
INTRAVENOUS | Status: DC
  Administered 2013-07-03: 10:00:00 via INTRAVENOUS
  Administered 2013-07-03: 1000 mL via INTRAVENOUS

## 2013-07-03 MED ORDER — BUPIVACAINE LIPOSOME 1.3 % IJ SUSP
20.0000 mL | Freq: Once | INTRAMUSCULAR | Status: DC
  Filled 2013-07-03: qty 20

## 2013-07-03 MED ORDER — ONDANSETRON HCL 4 MG PO TABS: 4.0000 mg | ORAL_TABLET | Freq: Four times a day (QID) | ORAL | Status: DC | PRN

## 2013-07-03 MED ORDER — BUPIVACAINE IN DEXTROSE 0.75-8.25 % IT SOLN
INTRATHECAL | Status: DC | PRN
  Administered 2013-07-03: 2 mL via INTRATHECAL

## 2013-07-03 MED ORDER — CELECOXIB 200 MG PO CAPS
200.0000 mg | ORAL_CAPSULE | Freq: Two times a day (BID) | ORAL | Status: DC
  Administered 2013-07-03 – 2013-07-04 (×3): 200 mg via ORAL
  Filled 2013-07-03 (×4): qty 1

## 2013-07-03 MED ORDER — PHENOL 1.4 % MT LIQD: 1.0000 | OROMUCOSAL | Status: DC | PRN

## 2013-07-03 MED ORDER — METOCLOPRAMIDE HCL 5 MG/ML IJ SOLN: 5.0000 mg | Freq: Three times a day (TID) | INTRAMUSCULAR | Status: DC | PRN

## 2013-07-03 MED ORDER — HYDROMORPHONE HCL PF 1 MG/ML IJ SOLN: 0.2500 mg | INTRAMUSCULAR | Status: DC | PRN

## 2013-07-03 MED ORDER — ONDANSETRON HCL 4 MG/2ML IJ SOLN
INTRAMUSCULAR | Status: DC | PRN
  Administered 2013-07-03: 4 mg via INTRAVENOUS

## 2013-07-03 MED ORDER — METHOCARBAMOL 100 MG/ML IJ SOLN
500.0000 mg | Freq: Four times a day (QID) | INTRAVENOUS | Status: DC | PRN
  Filled 2013-07-03: qty 5

## 2013-07-03 MED ORDER — PAROXETINE HCL 20 MG PO TABS
50.0000 mg | ORAL_TABLET | Freq: Every day | ORAL | Status: DC
  Administered 2013-07-04: 50 mg via ORAL
  Filled 2013-07-03: qty 1

## 2013-07-03 MED ORDER — DIPHENHYDRAMINE HCL 25 MG PO CAPS: 25.0000 mg | ORAL_CAPSULE | Freq: Four times a day (QID) | ORAL | Status: DC | PRN

## 2013-07-03 MED ORDER — FERROUS SULFATE 325 (65 FE) MG PO TABS
325.0000 mg | ORAL_TABLET | Freq: Three times a day (TID) | ORAL | Status: DC
  Administered 2013-07-03 – 2013-07-04 (×2): 325 mg via ORAL
  Filled 2013-07-03 (×5): qty 1

## 2013-07-03 MED ORDER — PAROXETINE HCL 10 MG PO TABS: 10.0000 mg | ORAL_TABLET | Freq: Every morning | ORAL | Status: DC

## 2013-07-03 MED ORDER — PROPOFOL INFUSION 10 MG/ML OPTIME
INTRAVENOUS | Status: DC | PRN
  Administered 2013-07-03: 140 ug/kg/min via INTRAVENOUS

## 2013-07-03 MED ORDER — CEFAZOLIN SODIUM-DEXTROSE 2-3 GM-% IV SOLR
INTRAVENOUS | Status: AC
  Filled 2013-07-03: qty 50

## 2013-07-03 MED ORDER — TRAZODONE HCL 50 MG PO TABS
50.0000 mg | ORAL_TABLET | Freq: Every day | ORAL | Status: DC
  Administered 2013-07-03: 50 mg via ORAL
  Filled 2013-07-03 (×2): qty 1

## 2013-07-03 MED ORDER — EPHEDRINE SULFATE 50 MG/ML IJ SOLN
INTRAMUSCULAR | Status: DC | PRN
  Administered 2013-07-03 (×2): 10 mg via INTRAVENOUS

## 2013-07-03 MED ORDER — HYDROMORPHONE HCL PF 1 MG/ML IJ SOLN: 0.5000 mg | INTRAMUSCULAR | Status: DC | PRN

## 2013-07-03 MED ORDER — FENTANYL CITRATE 0.05 MG/ML IJ SOLN
INTRAMUSCULAR | Status: DC | PRN
  Administered 2013-07-03: 100 ug via INTRAVENOUS

## 2013-07-03 MED ORDER — KETOROLAC TROMETHAMINE 30 MG/ML IJ SOLN
INTRAMUSCULAR | Status: DC | PRN
  Administered 2013-07-03: 30 mg

## 2013-07-03 MED ORDER — ASPIRIN EC 325 MG PO TBEC
325.0000 mg | DELAYED_RELEASE_TABLET | Freq: Two times a day (BID) | ORAL | Status: DC
  Administered 2013-07-04: 325 mg via ORAL
  Filled 2013-07-03 (×3): qty 1

## 2013-07-03 MED ORDER — METOCLOPRAMIDE HCL 10 MG PO TABS: 5.0000 mg | ORAL_TABLET | Freq: Three times a day (TID) | ORAL | Status: DC | PRN

## 2013-07-03 MED ORDER — OXYCODONE HCL 5 MG PO TABS
5.0000 mg | ORAL_TABLET | ORAL | Status: DC
  Administered 2013-07-03 (×3): 10 mg via ORAL
  Administered 2013-07-04: 5 mg via ORAL
  Administered 2013-07-04 (×2): 10 mg via ORAL
  Filled 2013-07-03 (×6): qty 2

## 2013-07-03 MED ORDER — SIMVASTATIN 10 MG PO TABS
10.0000 mg | ORAL_TABLET | Freq: Every day | ORAL | Status: DC
  Administered 2013-07-03: 10 mg via ORAL
  Filled 2013-07-03 (×2): qty 1

## 2013-07-03 MED ORDER — PAROXETINE HCL 20 MG PO TABS: 40.0000 mg | ORAL_TABLET | Freq: Every morning | ORAL | Status: DC

## 2013-07-03 MED ORDER — DOCUSATE SODIUM 100 MG PO CAPS
100.0000 mg | ORAL_CAPSULE | Freq: Two times a day (BID) | ORAL | Status: DC
  Administered 2013-07-03 – 2013-07-04 (×2): 100 mg via ORAL

## 2013-07-03 MED ORDER — ALUM & MAG HYDROXIDE-SIMETH 200-200-20 MG/5ML PO SUSP: 30.0000 mL | ORAL | Status: DC | PRN

## 2013-07-03 MED ORDER — 0.9 % SODIUM CHLORIDE (POUR BTL) OPTIME
TOPICAL | Status: DC | PRN
  Administered 2013-07-03: 1000 mL

## 2013-07-03 MED ORDER — MENTHOL 3 MG MT LOZG: 1.0000 | LOZENGE | OROMUCOSAL | Status: DC | PRN

## 2013-07-03 MED ORDER — SODIUM CHLORIDE 0.9 % IR SOLN
Status: DC | PRN
  Administered 2013-07-03: 1000 mL

## 2013-07-03 MED ORDER — CEFAZOLIN SODIUM-DEXTROSE 2-3 GM-% IV SOLR
2.0000 g | INTRAVENOUS | Status: AC
  Administered 2013-07-03: 2 g via INTRAVENOUS

## 2013-07-03 MED ORDER — TRANEXAMIC ACID 100 MG/ML IV SOLN
1000.0000 mg | Freq: Once | INTRAVENOUS | Status: AC
  Administered 2013-07-03: 1000 mg via INTRAVENOUS
  Filled 2013-07-03: qty 10

## 2013-07-03 MED ORDER — SODIUM CHLORIDE 0.9 % IV SOLN
INTRAVENOUS | Status: DC
  Administered 2013-07-03 – 2013-07-04 (×2): via INTRAVENOUS
  Filled 2013-07-03 (×4): qty 1000

## 2013-07-03 MED ORDER — DEXAMETHASONE SODIUM PHOSPHATE 10 MG/ML IJ SOLN
10.0000 mg | Freq: Once | INTRAMUSCULAR | Status: AC
  Administered 2013-07-03: 10 mg via INTRAVENOUS

## 2013-07-03 MED ORDER — PHENYLEPHRINE HCL 10 MG/ML IJ SOLN
10.0000 mg | INTRAMUSCULAR | Status: DC | PRN
  Administered 2013-07-03: 50 ug/min via INTRAVENOUS

## 2013-07-03 MED ORDER — CEFAZOLIN SODIUM-DEXTROSE 2-3 GM-% IV SOLR
2.0000 g | Freq: Four times a day (QID) | INTRAVENOUS | Status: AC
  Administered 2013-07-03 (×2): 2 g via INTRAVENOUS
  Filled 2013-07-03 (×2): qty 50

## 2013-07-03 MED ORDER — PROMETHAZINE HCL 25 MG/ML IJ SOLN: 6.2500 mg | INTRAMUSCULAR | Status: DC | PRN

## 2013-07-03 MED ORDER — METHOCARBAMOL 500 MG PO TABS: 500.0000 mg | ORAL_TABLET | Freq: Four times a day (QID) | ORAL | Status: DC | PRN

## 2013-07-03 MED ORDER — POLYETHYLENE GLYCOL 3350 17 G PO PACK
17.0000 g | PACK | Freq: Two times a day (BID) | ORAL | Status: DC
  Administered 2013-07-04: 17 g via ORAL

## 2013-07-03 MED ORDER — DEXAMETHASONE SODIUM PHOSPHATE 10 MG/ML IJ SOLN
10.0000 mg | Freq: Once | INTRAMUSCULAR | Status: AC
  Administered 2013-07-04: 10 mg via INTRAVENOUS
  Filled 2013-07-03: qty 1

## 2013-07-03 MED ORDER — TAMSULOSIN HCL 0.4 MG PO CAPS
0.4000 mg | ORAL_CAPSULE | Freq: Every day | ORAL | Status: DC
  Administered 2013-07-03: 0.4 mg via ORAL
  Filled 2013-07-03 (×2): qty 1

## 2013-07-03 MED ORDER — BISACODYL 10 MG RE SUPP: 10.0000 mg | Freq: Every day | RECTAL | Status: DC | PRN

## 2013-07-03 MED ORDER — BUPIVACAINE-EPINEPHRINE 0.25% -1:200000 IJ SOLN
INTRAMUSCULAR | Status: DC | PRN
  Administered 2013-07-03: 25 mL

## 2013-07-03 MED ORDER — MIDAZOLAM HCL 5 MG/5ML IJ SOLN
INTRAMUSCULAR | Status: DC | PRN
  Administered 2013-07-03: 2 mg via INTRAVENOUS

## 2013-07-03 MED ORDER — SODIUM CHLORIDE 0.9 % IJ SOLN
INTRAMUSCULAR | Status: DC | PRN
  Administered 2013-07-03: 10:00:00

## 2013-07-03 MED ORDER — CLONAZEPAM 0.5 MG PO TABS: 0.5000 mg | ORAL_TABLET | Freq: Every day | ORAL | Status: DC | PRN

## 2013-07-03 MED ORDER — NAPHAZOLINE HCL 0.1 % OP SOLN: 1.0000 [drp] | Freq: Four times a day (QID) | OPHTHALMIC | Status: DC | PRN

## 2013-07-03 MED ORDER — NIACIN 500 MG PO TABS
500.0000 mg | ORAL_TABLET | Freq: Every day | ORAL | Status: DC
  Administered 2013-07-04: 500 mg via ORAL
  Filled 2013-07-03 (×2): qty 1

## 2013-07-03 MED ORDER — ZOLPIDEM TARTRATE 5 MG PO TABS: 5.0000 mg | ORAL_TABLET | Freq: Every evening | ORAL | Status: DC | PRN

## 2013-07-03 MED ORDER — ONDANSETRON HCL 4 MG/2ML IJ SOLN: 4.0000 mg | Freq: Four times a day (QID) | INTRAMUSCULAR | Status: DC | PRN

## 2013-07-03 MED ORDER — FLEET ENEMA 7-19 GM/118ML RE ENEM: 1.0000 | ENEMA | Freq: Once | RECTAL | Status: AC | PRN

## 2013-07-03 SURGICAL SUPPLY — 54 items
BAG ZIPLOCK 12X15 (MISCELLANEOUS) ×2 IMPLANT
BANDAGE ELASTIC 6 VELCRO ST LF (GAUZE/BANDAGES/DRESSINGS) ×2 IMPLANT
BANDAGE ESMARK 6X9 LF (GAUZE/BANDAGES/DRESSINGS) ×1 IMPLANT
BLADE SAW SGTL 13.0X1.19X90.0M (BLADE) ×2 IMPLANT
BNDG ESMARK 6X9 LF (GAUZE/BANDAGES/DRESSINGS) ×2
BOWL SMART MIX CTS (DISPOSABLE) ×2 IMPLANT
CAPT RP KNEE ×2 IMPLANT
CEMENT HV SMART SET (Cement) ×4 IMPLANT
CLOTH BEACON ORANGE TIMEOUT ST (SAFETY) ×2 IMPLANT
CUFF TOURN SGL QUICK 34 (TOURNIQUET CUFF) ×1
CUFF TRNQT CYL 34X4X40X1 (TOURNIQUET CUFF) ×1 IMPLANT
DERMABOND ADVANCED (GAUZE/BANDAGES/DRESSINGS) ×1
DERMABOND ADVANCED .7 DNX12 (GAUZE/BANDAGES/DRESSINGS) ×1 IMPLANT
DRAPE EXTREMITY T 121X128X90 (DRAPE) ×2 IMPLANT
DRAPE POUCH INSTRU U-SHP 10X18 (DRAPES) ×2 IMPLANT
DRAPE U-SHAPE 47X51 STRL (DRAPES) ×2 IMPLANT
DRSG AQUACEL AG ADV 3.5X10 (GAUZE/BANDAGES/DRESSINGS) ×2 IMPLANT
DRSG TEGADERM 4X4.75 (GAUZE/BANDAGES/DRESSINGS) ×2 IMPLANT
DURAPREP 26ML APPLICATOR (WOUND CARE) ×4 IMPLANT
ELECT REM PT RETURN 9FT ADLT (ELECTROSURGICAL) ×2
ELECTRODE REM PT RTRN 9FT ADLT (ELECTROSURGICAL) ×1 IMPLANT
EVACUATOR 1/8 PVC DRAIN (DRAIN) ×2 IMPLANT
FACESHIELD LNG OPTICON STERILE (SAFETY) ×10 IMPLANT
GAUZE SPONGE 2X2 8PLY STRL LF (GAUZE/BANDAGES/DRESSINGS) ×1 IMPLANT
GLOVE BIOGEL PI IND STRL 7.5 (GLOVE) ×1 IMPLANT
GLOVE BIOGEL PI IND STRL 8 (GLOVE) ×1 IMPLANT
GLOVE BIOGEL PI INDICATOR 7.5 (GLOVE) ×1
GLOVE BIOGEL PI INDICATOR 8 (GLOVE) ×1
GLOVE ECLIPSE 8.0 STRL XLNG CF (GLOVE) ×2 IMPLANT
GLOVE ORTHO TXT STRL SZ7.5 (GLOVE) ×4 IMPLANT
GOWN BRE IMP PREV XXLGXLNG (GOWN DISPOSABLE) ×4 IMPLANT
GOWN STRL NON-REIN LRG LVL3 (GOWN DISPOSABLE) ×2 IMPLANT
HANDPIECE INTERPULSE COAX TIP (DISPOSABLE) ×1
KIT BASIN OR (CUSTOM PROCEDURE TRAY) ×2 IMPLANT
MANIFOLD NEPTUNE II (INSTRUMENTS) ×2 IMPLANT
NDL SAFETY ECLIPSE 18X1.5 (NEEDLE) ×1 IMPLANT
NEEDLE HYPO 18GX1.5 SHARP (NEEDLE) ×1
NS IRRIG 1000ML POUR BTL (IV SOLUTION) ×2 IMPLANT
PACK TOTAL JOINT (CUSTOM PROCEDURE TRAY) ×2 IMPLANT
POSITIONER SURGICAL ARM (MISCELLANEOUS) ×2 IMPLANT
SET HNDPC FAN SPRY TIP SCT (DISPOSABLE) ×1 IMPLANT
SET PAD KNEE POSITIONER (MISCELLANEOUS) ×2 IMPLANT
SPONGE GAUZE 2X2 STER 10/PKG (GAUZE/BANDAGES/DRESSINGS) ×1
SUCTION FRAZIER 12FR DISP (SUCTIONS) ×2 IMPLANT
SUT MNCRL AB 4-0 PS2 18 (SUTURE) ×2 IMPLANT
SUT VIC AB 1 CT1 36 (SUTURE) ×2 IMPLANT
SUT VIC AB 2-0 CT1 27 (SUTURE) ×3
SUT VIC AB 2-0 CT1 TAPERPNT 27 (SUTURE) ×3 IMPLANT
SUT VLOC 180 0 24IN GS25 (SUTURE) ×2 IMPLANT
SYR 50ML LL SCALE MARK (SYRINGE) ×2 IMPLANT
TOWEL OR 17X26 10 PK STRL BLUE (TOWEL DISPOSABLE) ×2 IMPLANT
TRAY FOLEY METER SIL LF 16FR (CATHETERS) ×2 IMPLANT
WATER STERILE IRR 1500ML POUR (IV SOLUTION) ×4 IMPLANT
WRAP KNEE MAXI GEL POST OP (GAUZE/BANDAGES/DRESSINGS) ×2 IMPLANT

## 2013-07-03 NOTE — Progress Notes (Signed)
[facility]   [facility] is providing the following services: Patient already has rw and commode from previous surgery.  If patient discharges after hours, please call [phone].   Renard Hamper [phone] 07/03/2013, 1:40 PM

## 2013-07-03 NOTE — Evaluation (Signed)
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 L Knee appeared still not 100% awake from Epidural but able to ambulate x 200'.plans DC to tomorrow.    PT Assessment  Patient needs continued PT services    Follow Up Recommendations  Home health PT    Does the patient have the potential to tolerate intense rehabilitation      Barriers to Discharge        Equipment Recommendations  None recommended by PT    Recommendations for Other Services     Frequency 7X/week    Precautions / Restrictions Precautions Precautions: Knee Restrictions Weight Bearing Restrictions: No   Pertinent Vitals/Pain No pain.     Mobility  Bed Mobility Bed Mobility: Supine to Sit;Sit to Supine Supine to Sit: 5: Supervision Sit to Supine: 4: Min assist Details for Bed Mobility Assistance: assist for RLE onto bed. Transfers Transfers: Sit to Stand;Stand to Sit Sit to Stand: 3: Mod assist;From bed;From elevated surface Stand to Sit: To bed;With upper extremity assist;4: Min guard Details for Transfer Assistance: cues for safety as R leg not 100% awake. Cues for hand placement and safety for sit/stand. Ambulation/Gait Ambulation/Gait Assistance: 4: Min assist Ambulation Distance (Feet): 200 Feet Assistive device: Rolling walker Ambulation/Gait Assistance Details: cues for safety due to R knee appears weak. Gait Pattern: Step-through pattern;Trunk flexed    Exercises Total Joint Exercises Heel Slides: AROM;Right;Supine;10 reps Straight Leg Raises: AROM;Right;10 reps;Supine   PT Diagnosis: Difficulty walking;Acute pain  PT Problem List: Decreased strength;Decreased range of motion;Decreased activity tolerance;Decreased mobility;Decreased  knowledge of use of DME;Decreased safety awareness;Pain PT Treatment Interventions: DME instruction;Gait training;Stair training;Functional mobility training;Therapeutic activities;Therapeutic exercise;Patient/family education     PT Goals(Current goals can be found in the care plan section) Acute Rehab PT Goals Patient Stated Goal: [place]. PT Goal Formulation: With patient Time For Goal Achievement: 07/10/13 Potential to Achieve Goals: Good  Visit Information  Last PT Received On: 07/03/13 Assistance Needed: +1 History of Present Illness: s/p LTKA 07/03/13       Prior Functioning  Home Living Family/patient expects to be discharged to:: Private residence Living Arrangements: Spouse/significant other Available Help at Discharge: Family Type of Home: House Home Access: Stairs to enter Secretary/administrator of Steps: 1 Entrance Stairs-Rails: None Home Layout: One level Home Equipment: Environmental consultant - 2 wheels Prior Function Level of Independence: Independent Communication Communication: HOH    Cognition  Cognition Arousal/Alertness: Awake/alert Behavior During Therapy: WFL for tasks assessed/performed Overall Cognitive Status: Within Functional Limits for tasks assessed    Extremity/Trunk Assessment Lower Extremity Assessment Lower Extremity Assessment: RLE deficits/detail RLE Deficits / Details: has lag in SLR, mild buckling during stance,  Cervical / Trunk Assessment Cervical / Trunk Assessment: Normal   Balance    End of Session PT - End of Session Activity Tolerance: Patient tolerated treatment well Patient left: in bed;with call bell/phone within reach Nurse Communication: Mobility status  GP     Rada Hay 07/03/2013, 5:32 PM Blanchard Kelch PT [phone]

## 2013-07-03 NOTE — Preoperative (Signed)
Beta Blockers   Reason not to administer Beta Blockers:Not Applicable 

## 2013-07-03 NOTE — Anesthesia Preprocedure Evaluation (Signed)
Anesthesia Evaluation  Patient identified by MRN, date of birth, ID band Patient awake    Reviewed: Allergy & Precautions, H&P , NPO status , Patient's Chart, lab work & pertinent test results  Airway Mallampati: II TM Distance: >3 FB Neck ROM: Full    Dental no notable dental hx.    Pulmonary neg pulmonary ROS,  breath sounds clear to auscultation  Pulmonary exam normal       Cardiovascular Exercise Tolerance: Good hypertension, Pt. on medications Rhythm:Regular Rate:Normal     Neuro/Psych PSYCHIATRIC DISORDERS Anxiety Depression negative neurological ROS     GI/Hepatic Neg liver ROS, GERD-  ,  Endo/Other  negative endocrine ROS  Renal/GU negative Renal ROS  negative genitourinary   Musculoskeletal negative musculoskeletal ROS (+)   Abdominal   Peds negative pediatric ROS (+)  Hematology negative hematology ROS (+)   Anesthesia Other Findings   Reproductive/Obstetrics negative OB ROS                           Anesthesia Physical Anesthesia Plan  ASA: II  Anesthesia Plan: Spinal   Post-op Pain Management:    Induction: Intravenous  Airway Management Planned:   Additional Equipment:   Intra-op Plan:   Post-operative Plan:   Informed Consent: I have reviewed the patients History and Physical, chart, labs and discussed the procedure including the risks, benefits and alternatives for the proposed anesthesia with the patient or authorized representative who has indicated his/her understanding and acceptance.   Dental advisory given  Plan Discussed with: CRNA  Anesthesia Plan Comments: (Discussed risks/benefits of spinal including headache, backache, failure, bleeding, infection, and nerve damage. Patient consents to spinal. Questions answered. Coagulation studies and platelet count acceptable.)        Anesthesia Quick Evaluation

## 2013-07-03 NOTE — Transfer of Care (Signed)
Immediate Anesthesia Transfer of Care Note  Patient: Nathaniel Lee  Procedure(s) Performed: Procedure(s): RIGHT TOTAL KNEE ARTHROPLASTY (Right)  Patient Location: PACU  Anesthesia Type:Spinal  Level of Consciousness: awake, alert  and oriented  Airway & Oxygen Therapy: Patient Spontanous Breathing  Post-op Assessment: Report given to PACU RN and Post -op Vital signs reviewed and stable  Post vital signs: Reviewed and stable  Complications: No apparent anesthesia complications

## 2013-07-03 NOTE — Interval H&P Note (Signed)
History and Physical Interval Note:  07/03/2013 6:56 AM  Nathaniel Lee  has presented today for surgery, with the diagnosis of RIGHT KNEE OA   The various methods of treatment have been discussed with the patient and family. After consideration of risks, benefits and other options for treatment, the patient has consented to  Procedure(s): RIGHT TOTAL KNEE ARTHROPLASTY (Right) as a surgical intervention .  The patient's history has been reviewed, patient examined, no change in status, stable for surgery.  I have reviewed the patient's chart and labs.  Questions were answered to the patient's satisfaction.     [name]

## 2013-07-03 NOTE — Progress Notes (Signed)
Utilization review completed.  

## 2013-07-03 NOTE — Anesthesia Procedure Notes (Signed)
Spinal  Patient location during procedure: OR Start time: 07/03/2013 8:35 AM Staffing Anesthesiologist: Azell Der Performed by: anesthesiologist  Preanesthetic Checklist Completed: patient identified, site marked, surgical consent, pre-op evaluation, timeout performed, IV checked, risks and benefits discussed and monitors and equipment checked Spinal Block Patient position: sitting Prep: Betadine Patient monitoring: heart rate, continuous pulse ox and blood pressure Approach: midline Location: L3-4 Injection technique: single-shot Needle Needle type: Sprotte  Needle gauge: 24 G Needle length: 9 cm Additional Notes Expiration date of kit checked and confirmed. Patient tolerated procedure well, without complications. Clear CSF. No paresthesia.

## 2013-07-03 NOTE — Anesthesia Postprocedure Evaluation (Signed)
  Anesthesia Post-op Note  Patient: Nathaniel Lee  Procedure(s) Performed: Procedure(s) (LRB): RIGHT TOTAL KNEE ARTHROPLASTY (Right)  Patient Location: PACU  Anesthesia Type: Spinal  Level of Consciousness: awake and alert   Airway and Oxygen Therapy: Patient Spontanous Breathing  Post-op Pain: mild  Post-op Assessment: Post-op Vital signs reviewed, Patient's Cardiovascular Status Stable, Respiratory Function Stable, Patent Airway and No signs of Nausea or vomiting  Last Vitals:  Filed Vitals:   07/03/13 1226  BP: 104/62  Pulse: 62  Temp: 36.6 C  Resp: 16    Post-op Vital Signs: stable   Complications: No apparent anesthesia complications. Spinal resolving normally.

## 2013-07-03 NOTE — Op Note (Signed)
NAME:  Nathaniel Lee                      MEDICAL RECORD NO.:  [id number]                             FACILITY:  [facility]      PHYSICIAN:  Madlyn Frankel. Charlann Boxer, M.D.  DATE OF BIRTH:  10-14-1946      DATE OF PROCEDURE:  07/03/2013                                     OPERATIVE REPORT         PREOPERATIVE DIAGNOSIS:  Right knee osteoarthritis.      POSTOPERATIVE DIAGNOSIS:  Right knee osteoarthritis. Status post left total knee replacement     FINDINGS:  The patient was noted to have complete loss of cartilage and   bone-on-bone arthritis with associated osteophytes in the medial and patellofemoral compartments of   the knee with a significant synovitis and associated effusion.      PROCEDURE:  Right total knee replacement.      COMPONENTS USED:  DePuy rotating platform posterior stabilized knee   system, a size 4N femur, 3 tibia, 12.5 mm PS insert, and 41 patellar   button.      SURGEON:  Madlyn Frankel. Charlann Boxer, M.D.      ASSISTANT:  Lanney Gins, PA-C.      ANESTHESIA:  Spinal.      SPECIMENS:  None.      COMPLICATION:  None.      DRAINS:  One Hemovac.  EBL: <150cc      TOURNIQUET TIME:   Total Tourniquet Time Documented: Thigh (Right) - 36 minutes Total: Thigh (Right) - 36 minutes  .      The patient was stable to the recovery room.      INDICATION FOR PROCEDURE:  Nathaniel Lee is a 67 y.o. male patient of   mine.  The patient had been seen, evaluated, and treated conservatively in the   office with medication, activity modification, and injections.  The patient had   radiographic changes of bone-on-bone arthritis with endplate sclerosis and osteophytes noted.      The patient failed conservative measures including medication, injections, and activity modification, and at this point was ready for more definitive measures.   Based on the radiographic changes and failed conservative measures, the patient   decided to proceed with total knee replacement.  Risks of infection,   DVT, component failure, need for revision surgery, postop course, and   expectations were all   discussed and reviewed.  Consent was obtained for benefit of pain   relief.      PROCEDURE IN DETAIL:  The patient was brought to the operative theater.   Once adequate anesthesia, preoperative antibiotics, 2 gm of Ancef administered, the patient was positioned supine with the right thigh tourniquet placed.  The  right lower extremity was prepped and draped in sterile fashion.  A time-   out was performed identifying the patient, planned procedure, and   extremity.      The right lower extremity was placed in the [facility] leg holder.  The leg was   exsanguinated, tourniquet elevated to 250 mmHg.  A midline incision was   made followed by median parapatellar arthrotomy.  Following initial  exposure, attention was first directed to the patella.  Precut   measurement was noted to be 24 mm.  I resected down to 14 mm and used a   41 patellar button to restore patellar height as well as cover the cut   surface.      The lug holes were drilled and a metal shim was placed to protect the   patella from retractors and saw blades.      At this point, attention was now directed to the femur.  The femoral   canal was opened with a drill, irrigated to try to prevent fat emboli.  An   intramedullary rod was passed at 3 degrees valgus, 10 mm of bone was   resected off the distal femur.  Following this resection, the tibia was   subluxated anteriorly.  Using the extramedullary guide, 10 mm of bone was resected off   the proximal lateral tibia.  We confirmed the gap would be   stable medially and laterally with a 10 mm insert as well as confirmed   the cut was perpendicular in the coronal plane, checking with an alignment rod.      Once this was done, I sized the femur to be a size 4 in the anterior-   posterior dimension, chose a narrow component based on medial and   lateral dimension.  The size 4 rotation  block was then pinned in   position anterior referenced using the C-clamp to set rotation.  The   anterior, posterior, and  chamfer cuts were made without difficulty nor   notching making certain that I was along the anterior cortex to help   with flexion gap stability.      The final box cut was made off the lateral aspect of distal femur.      At this point, the tibia was sized to be a size 3, the size 3 tray was   then pinned in position through the medial third of the tubercle,   drilled, and keel punched.  Trial reduction was now carried with a 4N femur,  3 tibia, first the 10 then the 12.5 mm insert, and the 41 patella botton.  The knee was brought to   extension, full extension with good flexion stability with the patella   tracking through the trochlea without application of pressure.  Given   all these findings, the trial components removed.  Final components were   opened and cement was mixed.  The knee was irrigated with normal saline   solution and pulse lavage.  The synovial lining was   then injected with 0.25% Marcaine with epinephrine and 1 cc of Toradol,   total of 61 cc.      The knee was irrigated.  Final implants were then cemented onto clean and   dried cut surfaces of bone with the knee brought to extension with a 12.5 mm trial insert.      Once the cement had fully cured, the excess cement was removed   throughout the knee.  I confirmed I was satisfied with the range of   motion and stability, and the final 12.5 mm PS insert was chosen.  It was   placed into the knee.      The tourniquet had been let down at 35 minutes.  No significant   hemostasis required.  The medium Hemovac drain was placed deep.  The   extensor mechanism was then reapproximated using #1 Vicryl with the knee  in flexion.  The   remaining wound was closed with 2-0 Vicryl and running 4-0 Monocryl.   The knee was cleaned, dried, dressed sterilely using Dermabond and   Aquacel dressing.   Drain site dressed separately.  The patient was then   brought to recovery room in stable condition, tolerating the procedure   well.   Please note that Physician Assistant, Lanney Gins, PA-C, was present for the entirety of the case, and was utilized for pre-operative positioning, peri-operative retractor management, general facilitation of the procedure.  He was also utilized for primary wound closure at the end of the case.              Madlyn Frankel Charlann Boxer, M.D.

## 2013-07-04 ENCOUNTER — Encounter (HOSPITAL_COMMUNITY): Payer: Self-pay | Admitting: Orthopedic Surgery

## 2013-07-04 LAB — BASIC METABOLIC PANEL
BUN: 22 mg/dL (ref 6–23)
CO2: 28 mEq/L (ref 19–32)
Calcium: 8.8 mg/dL (ref 8.4–10.5)
Creatinine, Ser: 0.86 mg/dL (ref 0.50–1.35)
Glucose, Bld: 143 mg/dL — ABNORMAL HIGH (ref 70–99)

## 2013-07-04 LAB — CBC
HCT: 36.8 % — ABNORMAL LOW (ref 39.0–52.0)
Hemoglobin: 12.8 g/dL — ABNORMAL LOW (ref 13.0–17.0)
MCH: 32.2 pg (ref 26.0–34.0)
MCV: 92.7 fL (ref 78.0–100.0)
RBC: 3.97 MIL/uL — ABNORMAL LOW (ref 4.22–5.81)

## 2013-07-04 MED ORDER — ASPIRIN 325 MG PO TBEC: 325.0000 mg | DELAYED_RELEASE_TABLET | Freq: Two times a day (BID) | ORAL | Status: AC

## 2013-07-04 MED ORDER — OXYCODONE HCL 5 MG PO TABS: 5.0000 mg | ORAL_TABLET | ORAL | Status: DC | PRN

## 2013-07-04 MED ORDER — TIZANIDINE HCL 4 MG PO CAPS: 4.0000 mg | ORAL_CAPSULE | Freq: Three times a day (TID) | ORAL | Status: DC | PRN

## 2013-07-04 MED ORDER — DSS 100 MG PO CAPS: 100.0000 mg | ORAL_CAPSULE | Freq: Two times a day (BID) | ORAL | Status: AC

## 2013-07-04 MED ORDER — POLYETHYLENE GLYCOL 3350 17 G PO PACK: 17.0000 g | PACK | Freq: Two times a day (BID) | ORAL | Status: DC

## 2013-07-04 NOTE — Progress Notes (Signed)
OT Cancellation Note  Patient Details Name: Nathaniel Lee MRN: [id number] DOB: 01-23-1946   Cancelled Treatment:    Reason Eval/Treat Not Completed: OT screened, no needs identified, will sign off  Lennox Laity [phone] 07/04/2013, 11:15 AM

## 2013-07-04 NOTE — Progress Notes (Signed)
[facility]  [facility] is providing the following services: Patient declined rw and commode.  Already has both at home.   If patient discharges after hours, please call [phone].   Renard Hamper [phone] 07/04/2013, 8:20 AM

## 2013-07-04 NOTE — Progress Notes (Signed)
Pt stable, scripts, d/c instructions given with no questions/concerns voiced by pt.  Pt transported via wheelchair to private vehicle where his girlfriend was waiting to drive him home.

## 2013-07-04 NOTE — Progress Notes (Signed)
   Subjective: 1 Day Post-Op Procedure(s) (LRB): RIGHT TOTAL KNEE ARTHROPLASTY (Right)   Patient reports pain as mild, pain well controlled. No events throughout the night. Ready to be discharged home.   Objective:   VITALS:   Filed Vitals:   07/04/13  BP: 159/76  Pulse: 76  Temp: 97.6 F (36.4 C)   Resp: 16    Neurovascular intact Dorsiflexion/Plantar flexion intact Incision: dressing C/D/I No cellulitis present Compartment soft  LABS  Recent Labs  07/04/13 0430  HGB 12.8*  HCT 36.8*  WBC 11.3*  PLT 182     Recent Labs  07/04/13 0430  NA 137  K 4.4  BUN 22  CREATININE 0.86  GLUCOSE 143*     Assessment/Plan: 1 Day Post-Op Procedure(s) (LRB): RIGHT TOTAL KNEE ARTHROPLASTY (Right) HV drain d/c'ed Foley cath d/c'ed Advance diet Up with therapy D/C IV fluids Discharge home with home health Follow up in 2 weeks at [facility] Follow up with OLIN,Blayne Garlick D in 2 weeks.  Contact information:  [facility] [address] [place] [address] [phone]    Expected ABLA  Treated with iron and will observe  Overweight (BMI 25-29.9) Estimated body mass index is 27.79 kg/(m^2) as calculated from the following:   Height as of this encounter: 5\' 5"  (1.651 m).   Weight as of this encounter: 75.751 kg (167 lb). Patient also counseled that weight may inhibit the healing process Patient counseled that losing weight will help with future health issues       Anastasio Auerbach. Tomio Kirk   PAC  07/04/2013, 10:04 AM

## 2013-07-04 NOTE — Progress Notes (Signed)
Physical Therapy Treatment Patient Details Name: CUTBERTO WINFREE MRN: [id number] DOB: [birth year] Today's Date: 07/04/2013 Time: 4098-1191 PT Time Calculation (min): 30 min  PT Assessment / Plan / Recommendation  History of Present Illness s/p LTKA 07/03/13   PT Comments   Pt has achieved goals and ready for Dc.  Follow Up Recommendations  Home health PT     Does the patient have the potential to tolerate intense rehabilitation     Barriers to Discharge        Equipment Recommendations       Recommendations for Other Services    Frequency 7X/week   Progress towards PT Goals Progress towards PT goals: Progressing toward goals  Plan Current plan remains appropriate    Precautions / Restrictions Precautions Precautions: Knee Restrictions Weight Bearing Restrictions: Yes   Pertinent Vitals/Pain Sore to  Flex. R knee    Mobility  Bed Mobility Supine to Sit: 6: Modified independent (Device/Increase time) Sit to Supine: 6: Modified independent (Device/Increase time) Transfers Sit to Stand: 6: Modified independent (Device/Increase time) Stand to Sit: 6: Modified independent (Device/Increase time) Ambulation/Gait Ambulation/Gait Assistance: 6: Modified independent (Device/Increase time) Ambulation Distance (Feet): 400 Feet Assistive device: Rolling walker Gait Pattern: Step-through pattern Stairs: Yes Stairs Assistance: 5: Supervision Stairs Assistance Details (indicate cue type and reason): cues for sequence and safety Stair Management Technique: No rails;Forwards;With walker Number of Stairs: 1    Exercises Total Joint Exercises Quad Sets: AROM;Right;10 reps Short Arc Quad: AROM;Right;10 reps Heel Slides: AROM;Right;10 reps Hip ABduction/ADduction: AROM;Right;10 reps Straight Leg Raises: AROM;Right;10 reps Goniometric -R knee 05-90   PT Diagnosis:    PT Problem List:   PT Treatment Interventions:     PT Goals (current goals can now be found in the care plan  section)    Visit Information  Last PT Received On: 07/04/13 Assistance Needed: +1 History of Present Illness: s/p LTKA 07/03/13    Subjective Data      Cognition  Cognition Arousal/Alertness: Awake/alert    Balance     End of Session PT - End of Session Activity Tolerance: Patient tolerated treatment well Patient left: in bed Nurse Communication: Mobility status   GP     Rada Hay 07/04/2013, 11:42 AM

## 2013-07-05 DIAGNOSIS — Z471 Aftercare following joint replacement surgery: Secondary | ICD-10-CM | POA: Diagnosis not present

## 2013-07-05 DIAGNOSIS — Z96659 Presence of unspecified artificial knee joint: Secondary | ICD-10-CM | POA: Diagnosis not present

## 2013-07-05 DIAGNOSIS — I10 Essential (primary) hypertension: Secondary | ICD-10-CM | POA: Diagnosis not present

## 2013-07-05 DIAGNOSIS — IMO0001 Reserved for inherently not codable concepts without codable children: Secondary | ICD-10-CM | POA: Diagnosis not present

## 2013-07-05 NOTE — Care Management Note (Signed)
    Page 1 of 1   07/05/2013     12:06:32 PM   CARE MANAGEMENT NOTE 07/05/2013  Patient:  Nathaniel, Lee   Account Number:  [id number]  Date Initiated:  07/04/2013  Documentation initiated by:  Colleen Can  Subjective/Objective Assessment:   dx total rt knee replacemnt     Action/Plan:   Home with [facility] services. Already has DME  Genevieve Norlander will provide [facility] services with start date 07/06/2013.   Anticipated DC Date:  07/04/2013   Anticipated DC Plan:  HOME W HOME HEALTH SERVICES      DC Planning Services  CM consult      [facility] Choice  HOME HEALTH   Choice offered to / List presented to:          [facility] arranged  HH-2 PT      [facility] agency  [facility]   Status of service:  Completed, signed off Medicare Important Message given?   (If response is "NO", the following Medicare IM given date fields will be blank) Date Medicare IM given:   Date Additional Medicare IM given:    Discharge Disposition:  HOME W HOME HEALTH SERVICES  Per UR Regulation:    If discussed at Long Length of Stay Meetings, dates discussed:    Comments:

## 2013-07-06 DIAGNOSIS — Z471 Aftercare following joint replacement surgery: Secondary | ICD-10-CM | POA: Diagnosis not present

## 2013-07-06 DIAGNOSIS — Z96659 Presence of unspecified artificial knee joint: Secondary | ICD-10-CM | POA: Diagnosis not present

## 2013-07-06 DIAGNOSIS — I10 Essential (primary) hypertension: Secondary | ICD-10-CM | POA: Diagnosis not present

## 2013-07-06 DIAGNOSIS — IMO0001 Reserved for inherently not codable concepts without codable children: Secondary | ICD-10-CM | POA: Diagnosis not present

## 2013-07-09 DIAGNOSIS — IMO0001 Reserved for inherently not codable concepts without codable children: Secondary | ICD-10-CM | POA: Diagnosis not present

## 2013-07-09 DIAGNOSIS — Z96659 Presence of unspecified artificial knee joint: Secondary | ICD-10-CM | POA: Diagnosis not present

## 2013-07-09 DIAGNOSIS — I10 Essential (primary) hypertension: Secondary | ICD-10-CM | POA: Diagnosis not present

## 2013-07-09 DIAGNOSIS — Z471 Aftercare following joint replacement surgery: Secondary | ICD-10-CM | POA: Diagnosis not present

## 2013-07-10 DIAGNOSIS — Z96659 Presence of unspecified artificial knee joint: Secondary | ICD-10-CM | POA: Diagnosis not present

## 2013-07-10 DIAGNOSIS — Z471 Aftercare following joint replacement surgery: Secondary | ICD-10-CM | POA: Diagnosis not present

## 2013-07-10 DIAGNOSIS — IMO0001 Reserved for inherently not codable concepts without codable children: Secondary | ICD-10-CM | POA: Diagnosis not present

## 2013-07-10 DIAGNOSIS — I10 Essential (primary) hypertension: Secondary | ICD-10-CM | POA: Diagnosis not present

## 2013-07-10 NOTE — Discharge Summary (Signed)
Physician Discharge Summary  Patient ID: Nathaniel Lee MRN: [id number] DOB/AGE: March 07, 1946 67 y.o.  Admit date: 07/03/2013 Discharge date: 07/04/2013   Procedures:  Procedure(s) (LRB): RIGHT TOTAL KNEE ARTHROPLASTY (Right)  Attending Physician:  Dr. Durene Romans   Admission Diagnoses:   Right knee OA / pain  Discharge Diagnoses:  Principal Problem:   S/P right TKA Active Problems:   Expected blood loss anemia   Overweight (BMI 25.0-29.9)  Past Medical History  Diagnosis Date  . Arthritis     R shoulder, both knees  . Depression   . Anxiety     h/o panic attacks - related to anxiety   . Hypertension     ekg- last one done 2010 at Dr. Jearl Klinefelter office- [place] , sees Dr. Tomasa Blase- [facility] now for PCP, [facility]   Marland Kitchen Hypercholesteremia   . History of bronchitis   . GERD (gastroesophageal reflux disease)     food related  . Deafness in right ear     HPI: Nathaniel Lee, 67 y.o. male, has a history of pain and functional disability in the right knee due to arthritis and has failed non-surgical conservative treatments for greater than 12 weeks to include NSAID's and/or analgesics, corticosteriod injections, viscosupplementation injections, supervised PT with diminished ADL's post treatment, use of assistive devices and activity modification. Onset of symptoms was gradual, starting 8 years ago with gradually worsening course since that time. The patient noted prior procedures on the knee to include arthroplasty on the left knee(s). Patient currently rates pain in the right knee(s) at 8 out of 10 with activity. Patient has night pain, worsening of pain with activity and weight bearing, pain that interferes with activities of daily living, pain with passive range of motion, crepitus and joint swelling. Patient has evidence of periarticular osteophytes and joint space narrowing by imaging studies. There is no active infection. Risks, benefits and expectations were discussed with  the patient. Patient understand the risks, benefits and expectations and wishes to proceed with surgery.   PCP: Noni Saupe., MD   Discharged Condition: good  Hospital Course:  Patient underwent the above stated procedure on 07/03/2013. Patient tolerated the procedure well and brought to the recovery room in good condition and subsequently to the floor.  POD #1 BP: 159/76 ; Pulse: 76 ; Temp: 97.6 F (36.4 C) ; Resp: 16  Pt's foley was removed, as well as the hemovac drain removed. IV was changed to a saline lock. Patient reports pain as mild, pain well controlled. No events throughout the night. Ready to be discharged home. Neurovascular intact, dorsiflexion/plantar flexion intact, incision: dressing C/D/I, no cellulitis present and compartment soft.   LABS  Basename    HGB  12.8  HCT  36.8    Discharge Exam: General appearance: alert, cooperative and no distress Extremities: Homans sign is negative, no sign of DVT, no edema, redness or tenderness in the calves or thighs and no ulcers, gangrene or trophic changes  Disposition:   Home-Health Care Svc with follow up in 2 weeks   Follow-up Information   Follow up with [name], MD. Schedule an appointment as soon as possible for a visit in 2 weeks.   Contact information:   [address] [place] [address] [phone]       Discharge Orders   Future Orders Complete By Expires     Call MD / Call 911  As directed     Comments:  If you experience chest pain or shortness of breath, CALL 911 and be transported to the hospital emergency room.  If you develope a fever above 101 F, pus (white drainage) or increased drainage or redness at the wound, or calf pain, call your surgeon's office.    Change dressing  As directed     Comments:      Maintain surgical dressing for 10-14 days, then change the dressing daily with sterile 4 x 4 inch gauze dressing and tape. Keep the area dry and clean.     Constipation Prevention  As directed     Comments:      Drink plenty of fluids.  Prune juice may be helpful.  You may use a stool softener, such as Colace (over the counter) 100 mg twice a day.  Use MiraLax (over the counter) for constipation as needed.    Diet - low sodium heart healthy  As directed     Discharge instructions  As directed     Comments:      Maintain surgical dressing for 10-14 days, then replace with gauze and tape. Keep the area dry and clean until follow up. Follow up in 2 weeks at [facility]. Call with any questions or concerns.    Driving restrictions  As directed     Comments:      No driving for 4 weeks    Increase activity slowly as tolerated  As directed     TED hose  As directed     Comments:      Use stockings (TED hose) for 2 weeks on both leg(s).  You may remove them at night for sleeping.    Weight bearing as tolerated  As directed          Medication List         aspirin 325 MG EC tablet  Take 1 tablet (325 mg total) by mouth 2 (two) times daily.     clonazePAM 0.5 MG tablet  Commonly known as:  KLONOPIN  Take 0.5 mg by mouth daily as needed (For acute stress.).     DSS 100 MG Caps  Take 100 mg by mouth 2 (two) times daily.     ferrous sulfate 325 (65 FE) MG tablet  Take 1 tablet (325 mg total) by mouth 3 (three) times daily after meals.     lisinopril 40 MG tablet  Commonly known as:  PRINIVIL,ZESTRIL  Take 40 mg by mouth at bedtime.     multivitamin with minerals Tabs tablet  Take 1 tablet by mouth every morning.     niacin 500 MG tablet  Take 500 mg by mouth daily with breakfast.     oxyCODONE 5 MG immediate release tablet  Commonly known as:  Oxy IR/ROXICODONE  Take 1-3 tablets (5-15 mg total) by mouth every 4 (four) hours as needed for pain.     PARoxetine 40 MG tablet  Commonly known as:  PAXIL  Take 40 mg by mouth every morning. He takes with a 10mg  tablet to equal his dose of 50mg .     PARoxetine 10 MG tablet    Commonly known as:  PAXIL  Take 10 mg by mouth every morning. He takes with a 40mg  tablet to equal his dose of 50mg .     polyethylene glycol packet  Commonly known as:  MIRALAX / GLYCOLAX  Take 17 g by mouth 2 (two) times daily.     pravastatin 20 MG tablet  Commonly known as:  PRAVACHOL  Take 20 mg by mouth at bedtime.     tamsulosin 0.4 MG Caps capsule  Commonly known as:  FLOMAX  Take 0.4 mg by mouth at bedtime.     tetrahydrozoline 0.05 % ophthalmic solution  Place 1 drop into both eyes daily as needed (For eye redness.).     tiZANidine 4 MG capsule  Commonly known as:  ZANAFLEX  Take 1 capsule (4 mg total) by mouth 3 (three) times daily as needed for muscle spasms.     traZODone 50 MG tablet  Commonly known as:  DESYREL  Take 50 mg by mouth at bedtime.     VITAMIN B 12 PO  Take 1 tablet by mouth every morning.         Signed: Anastasio Auerbach. Charly Holcomb   PAC  07/10/2013, 1:09 PM

## 2013-07-11 DIAGNOSIS — Z96659 Presence of unspecified artificial knee joint: Secondary | ICD-10-CM | POA: Diagnosis not present

## 2013-07-11 DIAGNOSIS — IMO0001 Reserved for inherently not codable concepts without codable children: Secondary | ICD-10-CM | POA: Diagnosis not present

## 2013-07-11 DIAGNOSIS — Z471 Aftercare following joint replacement surgery: Secondary | ICD-10-CM | POA: Diagnosis not present

## 2013-07-11 DIAGNOSIS — I10 Essential (primary) hypertension: Secondary | ICD-10-CM | POA: Diagnosis not present

## 2013-07-12 DIAGNOSIS — IMO0001 Reserved for inherently not codable concepts without codable children: Secondary | ICD-10-CM | POA: Diagnosis not present

## 2013-07-12 DIAGNOSIS — Z471 Aftercare following joint replacement surgery: Secondary | ICD-10-CM | POA: Diagnosis not present

## 2013-07-12 DIAGNOSIS — Z96659 Presence of unspecified artificial knee joint: Secondary | ICD-10-CM | POA: Diagnosis not present

## 2013-07-12 DIAGNOSIS — I10 Essential (primary) hypertension: Secondary | ICD-10-CM | POA: Diagnosis not present

## 2013-07-13 DIAGNOSIS — Z96659 Presence of unspecified artificial knee joint: Secondary | ICD-10-CM | POA: Diagnosis not present

## 2013-07-13 DIAGNOSIS — I10 Essential (primary) hypertension: Secondary | ICD-10-CM | POA: Diagnosis not present

## 2013-07-13 DIAGNOSIS — IMO0001 Reserved for inherently not codable concepts without codable children: Secondary | ICD-10-CM | POA: Diagnosis not present

## 2013-07-13 DIAGNOSIS — Z471 Aftercare following joint replacement surgery: Secondary | ICD-10-CM | POA: Diagnosis not present

## 2013-07-16 DIAGNOSIS — Z471 Aftercare following joint replacement surgery: Secondary | ICD-10-CM | POA: Diagnosis not present

## 2013-07-16 DIAGNOSIS — Z96659 Presence of unspecified artificial knee joint: Secondary | ICD-10-CM | POA: Diagnosis not present

## 2013-07-16 DIAGNOSIS — I10 Essential (primary) hypertension: Secondary | ICD-10-CM | POA: Diagnosis not present

## 2013-07-16 DIAGNOSIS — IMO0001 Reserved for inherently not codable concepts without codable children: Secondary | ICD-10-CM | POA: Diagnosis not present

## 2013-07-17 DIAGNOSIS — I10 Essential (primary) hypertension: Secondary | ICD-10-CM | POA: Diagnosis not present

## 2013-07-17 DIAGNOSIS — Z471 Aftercare following joint replacement surgery: Secondary | ICD-10-CM | POA: Diagnosis not present

## 2013-07-17 DIAGNOSIS — Z96659 Presence of unspecified artificial knee joint: Secondary | ICD-10-CM | POA: Diagnosis not present

## 2013-07-17 DIAGNOSIS — IMO0001 Reserved for inherently not codable concepts without codable children: Secondary | ICD-10-CM | POA: Diagnosis not present

## 2013-07-18 DIAGNOSIS — Z471 Aftercare following joint replacement surgery: Secondary | ICD-10-CM | POA: Diagnosis not present

## 2013-07-18 DIAGNOSIS — IMO0001 Reserved for inherently not codable concepts without codable children: Secondary | ICD-10-CM | POA: Diagnosis not present

## 2013-07-18 DIAGNOSIS — I10 Essential (primary) hypertension: Secondary | ICD-10-CM | POA: Diagnosis not present

## 2013-07-18 DIAGNOSIS — Z96659 Presence of unspecified artificial knee joint: Secondary | ICD-10-CM | POA: Diagnosis not present

## 2013-07-19 DIAGNOSIS — I10 Essential (primary) hypertension: Secondary | ICD-10-CM | POA: Diagnosis not present

## 2013-07-19 DIAGNOSIS — Z96659 Presence of unspecified artificial knee joint: Secondary | ICD-10-CM | POA: Diagnosis not present

## 2013-07-19 DIAGNOSIS — IMO0001 Reserved for inherently not codable concepts without codable children: Secondary | ICD-10-CM | POA: Diagnosis not present

## 2013-07-19 DIAGNOSIS — Z471 Aftercare following joint replacement surgery: Secondary | ICD-10-CM | POA: Diagnosis not present

## 2013-07-20 DIAGNOSIS — M1710 Unilateral primary osteoarthritis, unspecified knee: Secondary | ICD-10-CM | POA: Diagnosis not present

## 2013-07-23 DIAGNOSIS — M6281 Muscle weakness (generalized): Secondary | ICD-10-CM | POA: Diagnosis not present

## 2013-07-23 DIAGNOSIS — R269 Unspecified abnormalities of gait and mobility: Secondary | ICD-10-CM | POA: Diagnosis not present

## 2013-07-23 DIAGNOSIS — M1710 Unilateral primary osteoarthritis, unspecified knee: Secondary | ICD-10-CM | POA: Diagnosis not present

## 2013-07-24 DIAGNOSIS — M6281 Muscle weakness (generalized): Secondary | ICD-10-CM | POA: Diagnosis not present

## 2013-07-24 DIAGNOSIS — R269 Unspecified abnormalities of gait and mobility: Secondary | ICD-10-CM | POA: Diagnosis not present

## 2013-07-24 DIAGNOSIS — M1710 Unilateral primary osteoarthritis, unspecified knee: Secondary | ICD-10-CM | POA: Diagnosis not present

## 2013-07-26 DIAGNOSIS — R269 Unspecified abnormalities of gait and mobility: Secondary | ICD-10-CM | POA: Diagnosis not present

## 2013-07-26 DIAGNOSIS — M1710 Unilateral primary osteoarthritis, unspecified knee: Secondary | ICD-10-CM | POA: Diagnosis not present

## 2013-07-26 DIAGNOSIS — M6281 Muscle weakness (generalized): Secondary | ICD-10-CM | POA: Diagnosis not present

## 2013-08-01 DIAGNOSIS — M1710 Unilateral primary osteoarthritis, unspecified knee: Secondary | ICD-10-CM | POA: Diagnosis not present

## 2013-08-01 DIAGNOSIS — R269 Unspecified abnormalities of gait and mobility: Secondary | ICD-10-CM | POA: Diagnosis not present

## 2013-08-01 DIAGNOSIS — M6281 Muscle weakness (generalized): Secondary | ICD-10-CM | POA: Diagnosis not present

## 2013-08-03 DIAGNOSIS — R269 Unspecified abnormalities of gait and mobility: Secondary | ICD-10-CM | POA: Diagnosis not present

## 2013-08-03 DIAGNOSIS — M6281 Muscle weakness (generalized): Secondary | ICD-10-CM | POA: Diagnosis not present

## 2013-08-03 DIAGNOSIS — M1710 Unilateral primary osteoarthritis, unspecified knee: Secondary | ICD-10-CM | POA: Diagnosis not present

## 2013-08-06 DIAGNOSIS — M1710 Unilateral primary osteoarthritis, unspecified knee: Secondary | ICD-10-CM | POA: Diagnosis not present

## 2013-08-06 DIAGNOSIS — M6281 Muscle weakness (generalized): Secondary | ICD-10-CM | POA: Diagnosis not present

## 2013-08-06 DIAGNOSIS — R269 Unspecified abnormalities of gait and mobility: Secondary | ICD-10-CM | POA: Diagnosis not present

## 2013-08-08 DIAGNOSIS — M6281 Muscle weakness (generalized): Secondary | ICD-10-CM | POA: Diagnosis not present

## 2013-08-08 DIAGNOSIS — M1710 Unilateral primary osteoarthritis, unspecified knee: Secondary | ICD-10-CM | POA: Diagnosis not present

## 2013-08-08 DIAGNOSIS — R269 Unspecified abnormalities of gait and mobility: Secondary | ICD-10-CM | POA: Diagnosis not present

## 2013-08-10 DIAGNOSIS — M6281 Muscle weakness (generalized): Secondary | ICD-10-CM | POA: Diagnosis not present

## 2013-08-10 DIAGNOSIS — M1710 Unilateral primary osteoarthritis, unspecified knee: Secondary | ICD-10-CM | POA: Diagnosis not present

## 2013-08-10 DIAGNOSIS — R269 Unspecified abnormalities of gait and mobility: Secondary | ICD-10-CM | POA: Diagnosis not present

## 2013-08-13 DIAGNOSIS — M1710 Unilateral primary osteoarthritis, unspecified knee: Secondary | ICD-10-CM | POA: Diagnosis not present

## 2013-08-13 DIAGNOSIS — M6281 Muscle weakness (generalized): Secondary | ICD-10-CM | POA: Diagnosis not present

## 2013-08-13 DIAGNOSIS — R269 Unspecified abnormalities of gait and mobility: Secondary | ICD-10-CM | POA: Diagnosis not present

## 2013-08-15 DIAGNOSIS — M6281 Muscle weakness (generalized): Secondary | ICD-10-CM | POA: Diagnosis not present

## 2013-08-15 DIAGNOSIS — R269 Unspecified abnormalities of gait and mobility: Secondary | ICD-10-CM | POA: Diagnosis not present

## 2013-08-15 DIAGNOSIS — M1710 Unilateral primary osteoarthritis, unspecified knee: Secondary | ICD-10-CM | POA: Diagnosis not present

## 2013-08-16 DIAGNOSIS — M1710 Unilateral primary osteoarthritis, unspecified knee: Secondary | ICD-10-CM | POA: Diagnosis not present

## 2013-08-16 DIAGNOSIS — R269 Unspecified abnormalities of gait and mobility: Secondary | ICD-10-CM | POA: Diagnosis not present

## 2013-08-16 DIAGNOSIS — M6281 Muscle weakness (generalized): Secondary | ICD-10-CM | POA: Diagnosis not present

## 2013-08-20 DIAGNOSIS — M6281 Muscle weakness (generalized): Secondary | ICD-10-CM | POA: Diagnosis not present

## 2013-08-20 DIAGNOSIS — M1710 Unilateral primary osteoarthritis, unspecified knee: Secondary | ICD-10-CM | POA: Diagnosis not present

## 2013-08-20 DIAGNOSIS — R269 Unspecified abnormalities of gait and mobility: Secondary | ICD-10-CM | POA: Diagnosis not present

## 2013-08-22 DIAGNOSIS — M1710 Unilateral primary osteoarthritis, unspecified knee: Secondary | ICD-10-CM | POA: Diagnosis not present

## 2013-08-22 DIAGNOSIS — M6281 Muscle weakness (generalized): Secondary | ICD-10-CM | POA: Diagnosis not present

## 2013-08-22 DIAGNOSIS — R269 Unspecified abnormalities of gait and mobility: Secondary | ICD-10-CM | POA: Diagnosis not present

## 2013-08-24 DIAGNOSIS — M6281 Muscle weakness (generalized): Secondary | ICD-10-CM | POA: Diagnosis not present

## 2013-08-24 DIAGNOSIS — R269 Unspecified abnormalities of gait and mobility: Secondary | ICD-10-CM | POA: Diagnosis not present

## 2013-08-24 DIAGNOSIS — M1710 Unilateral primary osteoarthritis, unspecified knee: Secondary | ICD-10-CM | POA: Diagnosis not present

## 2013-08-27 DIAGNOSIS — R269 Unspecified abnormalities of gait and mobility: Secondary | ICD-10-CM | POA: Diagnosis not present

## 2013-08-27 DIAGNOSIS — M6281 Muscle weakness (generalized): Secondary | ICD-10-CM | POA: Diagnosis not present

## 2013-08-27 DIAGNOSIS — M1710 Unilateral primary osteoarthritis, unspecified knee: Secondary | ICD-10-CM | POA: Diagnosis not present

## 2013-08-29 DIAGNOSIS — M1710 Unilateral primary osteoarthritis, unspecified knee: Secondary | ICD-10-CM | POA: Diagnosis not present

## 2013-08-29 DIAGNOSIS — M6281 Muscle weakness (generalized): Secondary | ICD-10-CM | POA: Diagnosis not present

## 2013-08-29 DIAGNOSIS — R269 Unspecified abnormalities of gait and mobility: Secondary | ICD-10-CM | POA: Diagnosis not present

## 2013-09-03 DIAGNOSIS — M6281 Muscle weakness (generalized): Secondary | ICD-10-CM | POA: Diagnosis not present

## 2013-09-03 DIAGNOSIS — M1710 Unilateral primary osteoarthritis, unspecified knee: Secondary | ICD-10-CM | POA: Diagnosis not present

## 2013-09-03 DIAGNOSIS — R269 Unspecified abnormalities of gait and mobility: Secondary | ICD-10-CM | POA: Diagnosis not present

## 2013-09-05 DIAGNOSIS — R269 Unspecified abnormalities of gait and mobility: Secondary | ICD-10-CM | POA: Diagnosis not present

## 2013-09-05 DIAGNOSIS — M1710 Unilateral primary osteoarthritis, unspecified knee: Secondary | ICD-10-CM | POA: Diagnosis not present

## 2013-09-05 DIAGNOSIS — M6281 Muscle weakness (generalized): Secondary | ICD-10-CM | POA: Diagnosis not present

## 2013-09-12 DIAGNOSIS — M6281 Muscle weakness (generalized): Secondary | ICD-10-CM | POA: Diagnosis not present

## 2013-09-12 DIAGNOSIS — R269 Unspecified abnormalities of gait and mobility: Secondary | ICD-10-CM | POA: Diagnosis not present

## 2013-09-12 DIAGNOSIS — M1710 Unilateral primary osteoarthritis, unspecified knee: Secondary | ICD-10-CM | POA: Diagnosis not present

## 2013-09-14 DIAGNOSIS — R269 Unspecified abnormalities of gait and mobility: Secondary | ICD-10-CM | POA: Diagnosis not present

## 2013-09-14 DIAGNOSIS — M1710 Unilateral primary osteoarthritis, unspecified knee: Secondary | ICD-10-CM | POA: Diagnosis not present

## 2013-09-14 DIAGNOSIS — M6281 Muscle weakness (generalized): Secondary | ICD-10-CM | POA: Diagnosis not present

## 2013-09-18 DIAGNOSIS — R269 Unspecified abnormalities of gait and mobility: Secondary | ICD-10-CM | POA: Diagnosis not present

## 2013-09-18 DIAGNOSIS — M6281 Muscle weakness (generalized): Secondary | ICD-10-CM | POA: Diagnosis not present

## 2013-09-18 DIAGNOSIS — M1710 Unilateral primary osteoarthritis, unspecified knee: Secondary | ICD-10-CM | POA: Diagnosis not present

## 2013-09-21 DIAGNOSIS — M6281 Muscle weakness (generalized): Secondary | ICD-10-CM | POA: Diagnosis not present

## 2013-09-21 DIAGNOSIS — R269 Unspecified abnormalities of gait and mobility: Secondary | ICD-10-CM | POA: Diagnosis not present

## 2013-09-21 DIAGNOSIS — M1710 Unilateral primary osteoarthritis, unspecified knee: Secondary | ICD-10-CM | POA: Diagnosis not present

## 2013-09-28 DIAGNOSIS — R269 Unspecified abnormalities of gait and mobility: Secondary | ICD-10-CM | POA: Diagnosis not present

## 2013-09-28 DIAGNOSIS — M1710 Unilateral primary osteoarthritis, unspecified knee: Secondary | ICD-10-CM | POA: Diagnosis not present

## 2013-09-28 DIAGNOSIS — M6281 Muscle weakness (generalized): Secondary | ICD-10-CM | POA: Diagnosis not present

## 2013-10-01 DIAGNOSIS — R269 Unspecified abnormalities of gait and mobility: Secondary | ICD-10-CM | POA: Diagnosis not present

## 2013-10-01 DIAGNOSIS — M1710 Unilateral primary osteoarthritis, unspecified knee: Secondary | ICD-10-CM | POA: Diagnosis not present

## 2013-10-01 DIAGNOSIS — M6281 Muscle weakness (generalized): Secondary | ICD-10-CM | POA: Diagnosis not present

## 2013-10-05 DIAGNOSIS — M6281 Muscle weakness (generalized): Secondary | ICD-10-CM | POA: Diagnosis not present

## 2013-10-05 DIAGNOSIS — R269 Unspecified abnormalities of gait and mobility: Secondary | ICD-10-CM | POA: Diagnosis not present

## 2013-10-05 DIAGNOSIS — M1710 Unilateral primary osteoarthritis, unspecified knee: Secondary | ICD-10-CM | POA: Diagnosis not present

## 2013-10-10 DIAGNOSIS — R269 Unspecified abnormalities of gait and mobility: Secondary | ICD-10-CM | POA: Diagnosis not present

## 2013-10-10 DIAGNOSIS — M1710 Unilateral primary osteoarthritis, unspecified knee: Secondary | ICD-10-CM | POA: Diagnosis not present

## 2013-10-10 DIAGNOSIS — M6281 Muscle weakness (generalized): Secondary | ICD-10-CM | POA: Diagnosis not present

## 2013-10-12 DIAGNOSIS — M1710 Unilateral primary osteoarthritis, unspecified knee: Secondary | ICD-10-CM | POA: Diagnosis not present

## 2013-10-12 DIAGNOSIS — M6281 Muscle weakness (generalized): Secondary | ICD-10-CM | POA: Diagnosis not present

## 2013-10-12 DIAGNOSIS — R269 Unspecified abnormalities of gait and mobility: Secondary | ICD-10-CM | POA: Diagnosis not present

## 2013-11-14 DIAGNOSIS — Z23 Encounter for immunization: Secondary | ICD-10-CM | POA: Diagnosis not present

## 2014-01-08 DIAGNOSIS — E291 Testicular hypofunction: Secondary | ICD-10-CM | POA: Diagnosis not present

## 2014-01-08 DIAGNOSIS — E782 Mixed hyperlipidemia: Secondary | ICD-10-CM | POA: Diagnosis not present

## 2014-01-08 DIAGNOSIS — Z79899 Other long term (current) drug therapy: Secondary | ICD-10-CM | POA: Diagnosis not present

## 2014-01-08 DIAGNOSIS — E538 Deficiency of other specified B group vitamins: Secondary | ICD-10-CM | POA: Diagnosis not present

## 2014-02-26 DIAGNOSIS — Z0000 Encounter for general adult medical examination without abnormal findings: Secondary | ICD-10-CM | POA: Diagnosis not present

## 2014-02-26 DIAGNOSIS — N400 Enlarged prostate without lower urinary tract symptoms: Secondary | ICD-10-CM | POA: Diagnosis not present

## 2014-02-26 DIAGNOSIS — G4700 Insomnia, unspecified: Secondary | ICD-10-CM | POA: Diagnosis not present

## 2014-02-26 DIAGNOSIS — F17200 Nicotine dependence, unspecified, uncomplicated: Secondary | ICD-10-CM | POA: Diagnosis not present

## 2014-02-26 DIAGNOSIS — F410 Panic disorder [episodic paroxysmal anxiety] without agoraphobia: Secondary | ICD-10-CM | POA: Diagnosis not present

## 2014-03-26 DIAGNOSIS — F410 Panic disorder [episodic paroxysmal anxiety] without agoraphobia: Secondary | ICD-10-CM | POA: Diagnosis not present

## 2014-04-25 DIAGNOSIS — F410 Panic disorder [episodic paroxysmal anxiety] without agoraphobia: Secondary | ICD-10-CM | POA: Diagnosis not present

## 2014-04-25 DIAGNOSIS — F329 Major depressive disorder, single episode, unspecified: Secondary | ICD-10-CM | POA: Diagnosis not present

## 2014-04-25 DIAGNOSIS — F3289 Other specified depressive episodes: Secondary | ICD-10-CM | POA: Diagnosis not present

## 2014-05-22 DIAGNOSIS — G4730 Sleep apnea, unspecified: Secondary | ICD-10-CM | POA: Diagnosis not present

## 2014-05-22 DIAGNOSIS — R5381 Other malaise: Secondary | ICD-10-CM | POA: Diagnosis not present

## 2014-05-22 DIAGNOSIS — Z125 Encounter for screening for malignant neoplasm of prostate: Secondary | ICD-10-CM | POA: Diagnosis not present

## 2014-05-22 DIAGNOSIS — R5383 Other fatigue: Secondary | ICD-10-CM | POA: Diagnosis not present

## 2014-05-22 DIAGNOSIS — F17200 Nicotine dependence, unspecified, uncomplicated: Secondary | ICD-10-CM | POA: Diagnosis not present

## 2014-05-23 DIAGNOSIS — F329 Major depressive disorder, single episode, unspecified: Secondary | ICD-10-CM | POA: Diagnosis not present

## 2014-05-23 DIAGNOSIS — F3289 Other specified depressive episodes: Secondary | ICD-10-CM | POA: Diagnosis not present

## 2014-05-23 DIAGNOSIS — F410 Panic disorder [episodic paroxysmal anxiety] without agoraphobia: Secondary | ICD-10-CM | POA: Diagnosis not present

## 2014-05-28 DIAGNOSIS — E291 Testicular hypofunction: Secondary | ICD-10-CM | POA: Diagnosis not present

## 2014-06-06 DIAGNOSIS — G4730 Sleep apnea, unspecified: Secondary | ICD-10-CM | POA: Diagnosis not present

## 2014-06-06 DIAGNOSIS — G4733 Obstructive sleep apnea (adult) (pediatric): Secondary | ICD-10-CM | POA: Diagnosis not present

## 2014-06-10 DIAGNOSIS — E291 Testicular hypofunction: Secondary | ICD-10-CM | POA: Diagnosis not present

## 2014-06-20 DIAGNOSIS — F410 Panic disorder [episodic paroxysmal anxiety] without agoraphobia: Secondary | ICD-10-CM | POA: Diagnosis not present

## 2014-06-20 DIAGNOSIS — J069 Acute upper respiratory infection, unspecified: Secondary | ICD-10-CM | POA: Diagnosis not present

## 2014-06-20 DIAGNOSIS — F17200 Nicotine dependence, unspecified, uncomplicated: Secondary | ICD-10-CM | POA: Diagnosis not present

## 2014-07-31 DIAGNOSIS — G4730 Sleep apnea, unspecified: Secondary | ICD-10-CM | POA: Diagnosis not present

## 2014-07-31 DIAGNOSIS — G4710 Hypersomnia, unspecified: Secondary | ICD-10-CM | POA: Diagnosis not present

## 2014-08-14 DIAGNOSIS — G4710 Hypersomnia, unspecified: Secondary | ICD-10-CM | POA: Diagnosis not present

## 2014-08-14 DIAGNOSIS — F17200 Nicotine dependence, unspecified, uncomplicated: Secondary | ICD-10-CM | POA: Diagnosis not present

## 2014-08-14 DIAGNOSIS — R0989 Other specified symptoms and signs involving the circulatory and respiratory systems: Secondary | ICD-10-CM | POA: Diagnosis not present

## 2014-08-14 DIAGNOSIS — R0609 Other forms of dyspnea: Secondary | ICD-10-CM | POA: Diagnosis not present

## 2014-08-14 DIAGNOSIS — R5381 Other malaise: Secondary | ICD-10-CM | POA: Diagnosis not present

## 2014-08-14 DIAGNOSIS — G4730 Sleep apnea, unspecified: Secondary | ICD-10-CM | POA: Diagnosis not present

## 2014-08-14 DIAGNOSIS — R5383 Other fatigue: Secondary | ICD-10-CM | POA: Diagnosis not present

## 2014-08-19 DIAGNOSIS — G4710 Hypersomnia, unspecified: Secondary | ICD-10-CM | POA: Diagnosis not present

## 2014-08-19 DIAGNOSIS — G4730 Sleep apnea, unspecified: Secondary | ICD-10-CM | POA: Diagnosis not present

## 2014-08-21 DIAGNOSIS — F329 Major depressive disorder, single episode, unspecified: Secondary | ICD-10-CM | POA: Diagnosis not present

## 2014-08-21 DIAGNOSIS — F410 Panic disorder [episodic paroxysmal anxiety] without agoraphobia: Secondary | ICD-10-CM | POA: Diagnosis not present

## 2014-08-21 DIAGNOSIS — F3289 Other specified depressive episodes: Secondary | ICD-10-CM | POA: Diagnosis not present

## 2014-08-23 DIAGNOSIS — G4733 Obstructive sleep apnea (adult) (pediatric): Secondary | ICD-10-CM | POA: Diagnosis not present

## 2014-08-23 DIAGNOSIS — I10 Essential (primary) hypertension: Secondary | ICD-10-CM | POA: Diagnosis not present

## 2014-08-23 DIAGNOSIS — F17200 Nicotine dependence, unspecified, uncomplicated: Secondary | ICD-10-CM | POA: Diagnosis not present

## 2014-09-11 DIAGNOSIS — J069 Acute upper respiratory infection, unspecified: Secondary | ICD-10-CM | POA: Diagnosis not present

## 2014-09-19 DIAGNOSIS — F410 Panic disorder [episodic paroxysmal anxiety] without agoraphobia: Secondary | ICD-10-CM | POA: Diagnosis not present

## 2014-09-23 DIAGNOSIS — G4733 Obstructive sleep apnea (adult) (pediatric): Secondary | ICD-10-CM | POA: Diagnosis not present

## 2014-09-23 DIAGNOSIS — R5383 Other fatigue: Secondary | ICD-10-CM | POA: Diagnosis not present

## 2014-09-23 DIAGNOSIS — J329 Chronic sinusitis, unspecified: Secondary | ICD-10-CM | POA: Diagnosis not present

## 2014-09-23 DIAGNOSIS — R0600 Dyspnea, unspecified: Secondary | ICD-10-CM | POA: Diagnosis not present

## 2014-10-17 DIAGNOSIS — F410 Panic disorder [episodic paroxysmal anxiety] without agoraphobia: Secondary | ICD-10-CM | POA: Diagnosis not present

## 2014-10-17 DIAGNOSIS — F329 Major depressive disorder, single episode, unspecified: Secondary | ICD-10-CM | POA: Diagnosis not present

## 2014-12-02 DIAGNOSIS — Z23 Encounter for immunization: Secondary | ICD-10-CM | POA: Diagnosis not present

## 2014-12-17 DIAGNOSIS — F410 Panic disorder [episodic paroxysmal anxiety] without agoraphobia: Secondary | ICD-10-CM | POA: Diagnosis not present

## 2014-12-24 DIAGNOSIS — R5383 Other fatigue: Secondary | ICD-10-CM | POA: Diagnosis not present

## 2014-12-24 DIAGNOSIS — F17210 Nicotine dependence, cigarettes, uncomplicated: Secondary | ICD-10-CM | POA: Diagnosis not present

## 2014-12-24 DIAGNOSIS — G4733 Obstructive sleep apnea (adult) (pediatric): Secondary | ICD-10-CM | POA: Diagnosis not present

## 2015-01-28 DIAGNOSIS — F410 Panic disorder [episodic paroxysmal anxiety] without agoraphobia: Secondary | ICD-10-CM | POA: Diagnosis not present

## 2015-01-28 DIAGNOSIS — F329 Major depressive disorder, single episode, unspecified: Secondary | ICD-10-CM | POA: Diagnosis not present

## 2015-03-24 DIAGNOSIS — F331 Major depressive disorder, recurrent, moderate: Secondary | ICD-10-CM | POA: Diagnosis not present

## 2015-03-25 DIAGNOSIS — R5383 Other fatigue: Secondary | ICD-10-CM | POA: Diagnosis not present

## 2015-03-25 DIAGNOSIS — G4733 Obstructive sleep apnea (adult) (pediatric): Secondary | ICD-10-CM | POA: Diagnosis not present

## 2015-03-25 DIAGNOSIS — F17210 Nicotine dependence, cigarettes, uncomplicated: Secondary | ICD-10-CM | POA: Diagnosis not present

## 2015-03-25 DIAGNOSIS — R0600 Dyspnea, unspecified: Secondary | ICD-10-CM | POA: Diagnosis not present

## 2015-04-07 DIAGNOSIS — F419 Anxiety disorder, unspecified: Secondary | ICD-10-CM | POA: Diagnosis not present

## 2015-04-21 DIAGNOSIS — B356 Tinea cruris: Secondary | ICD-10-CM | POA: Diagnosis not present

## 2015-05-02 DIAGNOSIS — Z471 Aftercare following joint replacement surgery: Secondary | ICD-10-CM | POA: Diagnosis not present

## 2015-05-02 DIAGNOSIS — Z96611 Presence of right artificial shoulder joint: Secondary | ICD-10-CM | POA: Diagnosis not present

## 2015-05-08 DIAGNOSIS — F329 Major depressive disorder, single episode, unspecified: Secondary | ICD-10-CM | POA: Diagnosis not present

## 2015-05-27 DIAGNOSIS — Z0000 Encounter for general adult medical examination without abnormal findings: Secondary | ICD-10-CM | POA: Diagnosis not present

## 2015-05-27 DIAGNOSIS — Z1389 Encounter for screening for other disorder: Secondary | ICD-10-CM | POA: Diagnosis not present

## 2015-05-27 DIAGNOSIS — Z9181 History of falling: Secondary | ICD-10-CM | POA: Diagnosis not present

## 2015-05-27 DIAGNOSIS — I10 Essential (primary) hypertension: Secondary | ICD-10-CM | POA: Diagnosis not present

## 2015-05-27 DIAGNOSIS — R5383 Other fatigue: Secondary | ICD-10-CM | POA: Diagnosis not present

## 2015-05-27 DIAGNOSIS — E538 Deficiency of other specified B group vitamins: Secondary | ICD-10-CM | POA: Diagnosis not present

## 2015-05-27 DIAGNOSIS — M1990 Unspecified osteoarthritis, unspecified site: Secondary | ICD-10-CM | POA: Diagnosis not present

## 2015-06-05 DIAGNOSIS — F410 Panic disorder [episodic paroxysmal anxiety] without agoraphobia: Secondary | ICD-10-CM | POA: Diagnosis not present

## 2015-06-23 DIAGNOSIS — R0600 Dyspnea, unspecified: Secondary | ICD-10-CM | POA: Diagnosis not present

## 2015-06-23 DIAGNOSIS — R5383 Other fatigue: Secondary | ICD-10-CM | POA: Diagnosis not present

## 2015-06-23 DIAGNOSIS — F17210 Nicotine dependence, cigarettes, uncomplicated: Secondary | ICD-10-CM | POA: Diagnosis not present

## 2015-06-23 DIAGNOSIS — G4733 Obstructive sleep apnea (adult) (pediatric): Secondary | ICD-10-CM | POA: Diagnosis not present

## 2015-08-08 DIAGNOSIS — E291 Testicular hypofunction: Secondary | ICD-10-CM | POA: Diagnosis not present

## 2015-08-08 DIAGNOSIS — I10 Essential (primary) hypertension: Secondary | ICD-10-CM | POA: Diagnosis not present

## 2015-08-08 DIAGNOSIS — F17210 Nicotine dependence, cigarettes, uncomplicated: Secondary | ICD-10-CM | POA: Diagnosis not present

## 2015-08-08 DIAGNOSIS — N400 Enlarged prostate without lower urinary tract symptoms: Secondary | ICD-10-CM | POA: Diagnosis not present

## 2015-08-08 DIAGNOSIS — E780 Pure hypercholesterolemia: Secondary | ICD-10-CM | POA: Diagnosis not present

## 2015-08-08 DIAGNOSIS — Z23 Encounter for immunization: Secondary | ICD-10-CM | POA: Diagnosis not present

## 2015-08-15 DIAGNOSIS — E049 Nontoxic goiter, unspecified: Secondary | ICD-10-CM | POA: Diagnosis not present

## 2015-09-02 DIAGNOSIS — F329 Major depressive disorder, single episode, unspecified: Secondary | ICD-10-CM | POA: Diagnosis not present

## 2015-09-02 DIAGNOSIS — F410 Panic disorder [episodic paroxysmal anxiety] without agoraphobia: Secondary | ICD-10-CM | POA: Diagnosis not present

## 2015-09-16 DIAGNOSIS — R5383 Other fatigue: Secondary | ICD-10-CM | POA: Diagnosis not present

## 2015-09-16 DIAGNOSIS — R0600 Dyspnea, unspecified: Secondary | ICD-10-CM | POA: Diagnosis not present

## 2015-09-16 DIAGNOSIS — G4733 Obstructive sleep apnea (adult) (pediatric): Secondary | ICD-10-CM | POA: Diagnosis not present

## 2015-09-16 DIAGNOSIS — F17210 Nicotine dependence, cigarettes, uncomplicated: Secondary | ICD-10-CM | POA: Diagnosis not present

## 2015-10-13 DIAGNOSIS — N400 Enlarged prostate without lower urinary tract symptoms: Secondary | ICD-10-CM | POA: Diagnosis not present

## 2015-10-13 DIAGNOSIS — I10 Essential (primary) hypertension: Secondary | ICD-10-CM | POA: Diagnosis not present

## 2015-10-13 DIAGNOSIS — J329 Chronic sinusitis, unspecified: Secondary | ICD-10-CM | POA: Diagnosis not present

## 2015-12-03 DIAGNOSIS — F410 Panic disorder [episodic paroxysmal anxiety] without agoraphobia: Secondary | ICD-10-CM | POA: Diagnosis not present

## 2015-12-11 DIAGNOSIS — F419 Anxiety disorder, unspecified: Secondary | ICD-10-CM | POA: Diagnosis not present

## 2015-12-11 DIAGNOSIS — J209 Acute bronchitis, unspecified: Secondary | ICD-10-CM | POA: Diagnosis not present

## 2015-12-11 DIAGNOSIS — G4700 Insomnia, unspecified: Secondary | ICD-10-CM | POA: Diagnosis not present

## 2015-12-11 DIAGNOSIS — N400 Enlarged prostate without lower urinary tract symptoms: Secondary | ICD-10-CM | POA: Diagnosis not present

## 2015-12-16 DIAGNOSIS — R5383 Other fatigue: Secondary | ICD-10-CM | POA: Diagnosis not present

## 2015-12-16 DIAGNOSIS — R0600 Dyspnea, unspecified: Secondary | ICD-10-CM | POA: Diagnosis not present

## 2015-12-16 DIAGNOSIS — F17210 Nicotine dependence, cigarettes, uncomplicated: Secondary | ICD-10-CM | POA: Diagnosis not present

## 2015-12-16 DIAGNOSIS — G4733 Obstructive sleep apnea (adult) (pediatric): Secondary | ICD-10-CM | POA: Diagnosis not present

## 2016-01-01 DIAGNOSIS — F329 Major depressive disorder, single episode, unspecified: Secondary | ICD-10-CM | POA: Diagnosis not present

## 2016-01-01 DIAGNOSIS — F410 Panic disorder [episodic paroxysmal anxiety] without agoraphobia: Secondary | ICD-10-CM | POA: Diagnosis not present

## 2016-03-05 DIAGNOSIS — R0600 Dyspnea, unspecified: Secondary | ICD-10-CM | POA: Diagnosis not present

## 2016-03-05 DIAGNOSIS — F17210 Nicotine dependence, cigarettes, uncomplicated: Secondary | ICD-10-CM | POA: Diagnosis not present

## 2016-03-05 DIAGNOSIS — R5383 Other fatigue: Secondary | ICD-10-CM | POA: Diagnosis not present

## 2016-03-05 DIAGNOSIS — J310 Chronic rhinitis: Secondary | ICD-10-CM | POA: Diagnosis not present

## 2016-03-05 DIAGNOSIS — G4733 Obstructive sleep apnea (adult) (pediatric): Secondary | ICD-10-CM | POA: Diagnosis not present

## 2016-03-10 DIAGNOSIS — F329 Major depressive disorder, single episode, unspecified: Secondary | ICD-10-CM | POA: Diagnosis not present

## 2016-03-10 DIAGNOSIS — F410 Panic disorder [episodic paroxysmal anxiety] without agoraphobia: Secondary | ICD-10-CM | POA: Diagnosis not present

## 2016-03-24 DIAGNOSIS — F410 Panic disorder [episodic paroxysmal anxiety] without agoraphobia: Secondary | ICD-10-CM | POA: Diagnosis not present

## 2016-04-21 DIAGNOSIS — F419 Anxiety disorder, unspecified: Secondary | ICD-10-CM | POA: Diagnosis not present

## 2016-05-18 DIAGNOSIS — H40039 Anatomical narrow angle, unspecified eye: Secondary | ICD-10-CM | POA: Diagnosis not present

## 2016-05-18 DIAGNOSIS — H04123 Dry eye syndrome of bilateral lacrimal glands: Secondary | ICD-10-CM | POA: Diagnosis not present

## 2016-05-18 DIAGNOSIS — H25813 Combined forms of age-related cataract, bilateral: Secondary | ICD-10-CM | POA: Diagnosis not present

## 2016-05-20 DIAGNOSIS — F419 Anxiety disorder, unspecified: Secondary | ICD-10-CM | POA: Diagnosis not present

## 2016-06-11 DIAGNOSIS — F3342 Major depressive disorder, recurrent, in full remission: Secondary | ICD-10-CM | POA: Diagnosis not present

## 2016-06-18 DIAGNOSIS — G4733 Obstructive sleep apnea (adult) (pediatric): Secondary | ICD-10-CM | POA: Diagnosis not present

## 2016-06-18 DIAGNOSIS — J4520 Mild intermittent asthma, uncomplicated: Secondary | ICD-10-CM | POA: Diagnosis not present

## 2016-06-18 DIAGNOSIS — F17210 Nicotine dependence, cigarettes, uncomplicated: Secondary | ICD-10-CM | POA: Diagnosis not present

## 2016-06-18 DIAGNOSIS — R5383 Other fatigue: Secondary | ICD-10-CM | POA: Diagnosis not present

## 2016-06-24 DIAGNOSIS — G4733 Obstructive sleep apnea (adult) (pediatric): Secondary | ICD-10-CM | POA: Diagnosis not present

## 2016-07-12 DIAGNOSIS — E785 Hyperlipidemia, unspecified: Secondary | ICD-10-CM | POA: Diagnosis not present

## 2016-07-12 DIAGNOSIS — Z125 Encounter for screening for malignant neoplasm of prostate: Secondary | ICD-10-CM | POA: Diagnosis not present

## 2016-07-12 DIAGNOSIS — Z79899 Other long term (current) drug therapy: Secondary | ICD-10-CM | POA: Diagnosis not present

## 2016-07-26 DIAGNOSIS — Z1389 Encounter for screening for other disorder: Secondary | ICD-10-CM | POA: Diagnosis not present

## 2016-07-26 DIAGNOSIS — I10 Essential (primary) hypertension: Secondary | ICD-10-CM | POA: Diagnosis not present

## 2016-07-26 DIAGNOSIS — Z0000 Encounter for general adult medical examination without abnormal findings: Secondary | ICD-10-CM | POA: Diagnosis not present

## 2016-07-26 DIAGNOSIS — Z9181 History of falling: Secondary | ICD-10-CM | POA: Diagnosis not present

## 2016-07-26 DIAGNOSIS — E785 Hyperlipidemia, unspecified: Secondary | ICD-10-CM | POA: Diagnosis not present

## 2016-08-12 DIAGNOSIS — F329 Major depressive disorder, single episode, unspecified: Secondary | ICD-10-CM | POA: Diagnosis not present

## 2016-08-12 DIAGNOSIS — F410 Panic disorder [episodic paroxysmal anxiety] without agoraphobia: Secondary | ICD-10-CM | POA: Diagnosis not present

## 2016-10-12 DIAGNOSIS — F410 Panic disorder [episodic paroxysmal anxiety] without agoraphobia: Secondary | ICD-10-CM | POA: Diagnosis not present

## 2016-10-12 DIAGNOSIS — F329 Major depressive disorder, single episode, unspecified: Secondary | ICD-10-CM | POA: Diagnosis not present

## 2016-11-08 DIAGNOSIS — I10 Essential (primary) hypertension: Secondary | ICD-10-CM | POA: Diagnosis not present

## 2016-11-08 DIAGNOSIS — Z23 Encounter for immunization: Secondary | ICD-10-CM | POA: Diagnosis not present

## 2016-11-08 DIAGNOSIS — E7800 Pure hypercholesterolemia, unspecified: Secondary | ICD-10-CM | POA: Diagnosis not present

## 2016-11-08 DIAGNOSIS — F17210 Nicotine dependence, cigarettes, uncomplicated: Secondary | ICD-10-CM | POA: Diagnosis not present

## 2016-11-08 DIAGNOSIS — M1990 Unspecified osteoarthritis, unspecified site: Secondary | ICD-10-CM | POA: Diagnosis not present

## 2016-12-09 DIAGNOSIS — F329 Major depressive disorder, single episode, unspecified: Secondary | ICD-10-CM | POA: Diagnosis not present

## 2016-12-09 DIAGNOSIS — F410 Panic disorder [episodic paroxysmal anxiety] without agoraphobia: Secondary | ICD-10-CM | POA: Diagnosis not present

## 2016-12-17 DIAGNOSIS — F17210 Nicotine dependence, cigarettes, uncomplicated: Secondary | ICD-10-CM | POA: Diagnosis not present

## 2016-12-17 DIAGNOSIS — R5383 Other fatigue: Secondary | ICD-10-CM | POA: Diagnosis not present

## 2016-12-17 DIAGNOSIS — G4733 Obstructive sleep apnea (adult) (pediatric): Secondary | ICD-10-CM | POA: Diagnosis not present

## 2017-03-02 DIAGNOSIS — F410 Panic disorder [episodic paroxysmal anxiety] without agoraphobia: Secondary | ICD-10-CM | POA: Diagnosis not present

## 2017-03-02 DIAGNOSIS — F329 Major depressive disorder, single episode, unspecified: Secondary | ICD-10-CM | POA: Diagnosis not present

## 2017-05-06 DIAGNOSIS — Z79899 Other long term (current) drug therapy: Secondary | ICD-10-CM | POA: Diagnosis not present

## 2017-05-06 DIAGNOSIS — E785 Hyperlipidemia, unspecified: Secondary | ICD-10-CM | POA: Diagnosis not present

## 2017-05-06 DIAGNOSIS — R5383 Other fatigue: Secondary | ICD-10-CM | POA: Diagnosis not present

## 2017-05-06 DIAGNOSIS — M545 Low back pain: Secondary | ICD-10-CM | POA: Diagnosis not present

## 2017-05-06 DIAGNOSIS — F419 Anxiety disorder, unspecified: Secondary | ICD-10-CM | POA: Diagnosis not present

## 2017-05-06 DIAGNOSIS — Z6830 Body mass index (BMI) 30.0-30.9, adult: Secondary | ICD-10-CM | POA: Diagnosis not present

## 2017-05-06 DIAGNOSIS — I10 Essential (primary) hypertension: Secondary | ICD-10-CM | POA: Diagnosis not present

## 2017-05-11 DIAGNOSIS — F411 Generalized anxiety disorder: Secondary | ICD-10-CM | POA: Diagnosis not present

## 2017-05-26 DIAGNOSIS — F329 Major depressive disorder, single episode, unspecified: Secondary | ICD-10-CM | POA: Diagnosis not present

## 2017-05-26 DIAGNOSIS — F410 Panic disorder [episodic paroxysmal anxiety] without agoraphobia: Secondary | ICD-10-CM | POA: Diagnosis not present

## 2017-06-07 DIAGNOSIS — G4733 Obstructive sleep apnea (adult) (pediatric): Secondary | ICD-10-CM | POA: Diagnosis not present

## 2017-06-16 DIAGNOSIS — G4733 Obstructive sleep apnea (adult) (pediatric): Secondary | ICD-10-CM | POA: Diagnosis not present

## 2017-06-16 DIAGNOSIS — R5383 Other fatigue: Secondary | ICD-10-CM | POA: Diagnosis not present

## 2017-06-16 DIAGNOSIS — J310 Chronic rhinitis: Secondary | ICD-10-CM | POA: Diagnosis not present

## 2017-06-16 DIAGNOSIS — F17210 Nicotine dependence, cigarettes, uncomplicated: Secondary | ICD-10-CM | POA: Diagnosis not present

## 2017-08-22 DIAGNOSIS — F410 Panic disorder [episodic paroxysmal anxiety] without agoraphobia: Secondary | ICD-10-CM | POA: Diagnosis not present

## 2017-08-22 DIAGNOSIS — F329 Major depressive disorder, single episode, unspecified: Secondary | ICD-10-CM | POA: Diagnosis not present

## 2017-09-21 DIAGNOSIS — J310 Chronic rhinitis: Secondary | ICD-10-CM | POA: Diagnosis not present

## 2017-09-21 DIAGNOSIS — F17210 Nicotine dependence, cigarettes, uncomplicated: Secondary | ICD-10-CM | POA: Diagnosis not present

## 2017-09-21 DIAGNOSIS — J4520 Mild intermittent asthma, uncomplicated: Secondary | ICD-10-CM | POA: Diagnosis not present

## 2017-09-21 DIAGNOSIS — R5383 Other fatigue: Secondary | ICD-10-CM | POA: Diagnosis not present

## 2017-09-21 DIAGNOSIS — G4733 Obstructive sleep apnea (adult) (pediatric): Secondary | ICD-10-CM | POA: Diagnosis not present

## 2017-09-26 DIAGNOSIS — G4733 Obstructive sleep apnea (adult) (pediatric): Secondary | ICD-10-CM | POA: Diagnosis not present

## 2017-09-29 DIAGNOSIS — Z23 Encounter for immunization: Secondary | ICD-10-CM | POA: Diagnosis not present

## 2017-11-16 DIAGNOSIS — F329 Major depressive disorder, single episode, unspecified: Secondary | ICD-10-CM | POA: Diagnosis not present

## 2017-11-16 DIAGNOSIS — F410 Panic disorder [episodic paroxysmal anxiety] without agoraphobia: Secondary | ICD-10-CM | POA: Diagnosis not present

## 2017-12-20 DIAGNOSIS — F17210 Nicotine dependence, cigarettes, uncomplicated: Secondary | ICD-10-CM | POA: Diagnosis not present

## 2017-12-20 DIAGNOSIS — J4520 Mild intermittent asthma, uncomplicated: Secondary | ICD-10-CM | POA: Diagnosis not present

## 2017-12-20 DIAGNOSIS — G4733 Obstructive sleep apnea (adult) (pediatric): Secondary | ICD-10-CM | POA: Diagnosis not present

## 2017-12-20 DIAGNOSIS — R5383 Other fatigue: Secondary | ICD-10-CM | POA: Diagnosis not present

## 2017-12-26 DIAGNOSIS — G4733 Obstructive sleep apnea (adult) (pediatric): Secondary | ICD-10-CM | POA: Diagnosis not present

## 2018-02-14 DIAGNOSIS — F410 Panic disorder [episodic paroxysmal anxiety] without agoraphobia: Secondary | ICD-10-CM | POA: Diagnosis not present

## 2018-02-14 DIAGNOSIS — F329 Major depressive disorder, single episode, unspecified: Secondary | ICD-10-CM | POA: Diagnosis not present

## 2018-03-21 DIAGNOSIS — F17210 Nicotine dependence, cigarettes, uncomplicated: Secondary | ICD-10-CM | POA: Diagnosis not present

## 2018-03-21 DIAGNOSIS — J4520 Mild intermittent asthma, uncomplicated: Secondary | ICD-10-CM | POA: Diagnosis not present

## 2018-04-21 DIAGNOSIS — H60539 Acute contact otitis externa, unspecified ear: Secondary | ICD-10-CM | POA: Diagnosis not present

## 2018-04-21 DIAGNOSIS — T162XXA Foreign body in left ear, initial encounter: Secondary | ICD-10-CM | POA: Diagnosis not present

## 2018-05-15 DIAGNOSIS — F410 Panic disorder [episodic paroxysmal anxiety] without agoraphobia: Secondary | ICD-10-CM | POA: Diagnosis not present

## 2018-05-15 DIAGNOSIS — F329 Major depressive disorder, single episode, unspecified: Secondary | ICD-10-CM | POA: Diagnosis not present

## 2018-05-24 DIAGNOSIS — Z1339 Encounter for screening examination for other mental health and behavioral disorders: Secondary | ICD-10-CM | POA: Diagnosis not present

## 2018-05-24 DIAGNOSIS — Z9181 History of falling: Secondary | ICD-10-CM | POA: Diagnosis not present

## 2018-05-24 DIAGNOSIS — Z6830 Body mass index (BMI) 30.0-30.9, adult: Secondary | ICD-10-CM | POA: Diagnosis not present

## 2018-05-24 DIAGNOSIS — I10 Essential (primary) hypertension: Secondary | ICD-10-CM | POA: Diagnosis not present

## 2018-05-24 DIAGNOSIS — Z0000 Encounter for general adult medical examination without abnormal findings: Secondary | ICD-10-CM | POA: Diagnosis not present

## 2018-05-25 DIAGNOSIS — Z79899 Other long term (current) drug therapy: Secondary | ICD-10-CM | POA: Diagnosis not present

## 2018-05-25 DIAGNOSIS — E785 Hyperlipidemia, unspecified: Secondary | ICD-10-CM | POA: Diagnosis not present

## 2018-05-25 DIAGNOSIS — M109 Gout, unspecified: Secondary | ICD-10-CM | POA: Diagnosis not present

## 2018-06-12 DIAGNOSIS — G4733 Obstructive sleep apnea (adult) (pediatric): Secondary | ICD-10-CM | POA: Diagnosis not present

## 2018-06-13 DIAGNOSIS — F17210 Nicotine dependence, cigarettes, uncomplicated: Secondary | ICD-10-CM | POA: Diagnosis not present

## 2018-06-13 DIAGNOSIS — G4733 Obstructive sleep apnea (adult) (pediatric): Secondary | ICD-10-CM | POA: Diagnosis not present

## 2018-06-13 DIAGNOSIS — J4520 Mild intermittent asthma, uncomplicated: Secondary | ICD-10-CM | POA: Diagnosis not present

## 2018-07-28 DIAGNOSIS — E782 Mixed hyperlipidemia: Secondary | ICD-10-CM | POA: Diagnosis not present

## 2018-07-28 DIAGNOSIS — R942 Abnormal results of pulmonary function studies: Secondary | ICD-10-CM | POA: Diagnosis not present

## 2018-07-28 DIAGNOSIS — R17 Unspecified jaundice: Secondary | ICD-10-CM | POA: Diagnosis not present

## 2018-07-29 ENCOUNTER — Inpatient Hospital Stay (HOSPITAL_COMMUNITY)
Admission: AD | Admit: 2018-07-29 | Discharge: 2018-07-31 | DRG: 445 | Disposition: A | Discharge status: Home / Self Care | Payer: Medicare Other | Source: Other Acute Inpatient Hospital | Attending: Internal Medicine | Admitting: Internal Medicine

## 2018-07-29 ENCOUNTER — Encounter (HOSPITAL_COMMUNITY): Payer: Self-pay | Admitting: Family Medicine

## 2018-07-29 ENCOUNTER — Other Ambulatory Visit: Payer: Self-pay

## 2018-07-29 DIAGNOSIS — I10 Essential (primary) hypertension: Secondary | ICD-10-CM | POA: Diagnosis present

## 2018-07-29 DIAGNOSIS — R739 Hyperglycemia, unspecified: Secondary | ICD-10-CM | POA: Diagnosis not present

## 2018-07-29 DIAGNOSIS — Z9989 Dependence on other enabling machines and devices: Secondary | ICD-10-CM

## 2018-07-29 DIAGNOSIS — K831 Obstruction of bile duct: Principal | ICD-10-CM | POA: Diagnosis present

## 2018-07-29 DIAGNOSIS — K869 Disease of pancreas, unspecified: Secondary | ICD-10-CM

## 2018-07-29 DIAGNOSIS — J449 Chronic obstructive pulmonary disease, unspecified: Secondary | ICD-10-CM | POA: Diagnosis present

## 2018-07-29 DIAGNOSIS — Z79899 Other long term (current) drug therapy: Secondary | ICD-10-CM | POA: Diagnosis not present

## 2018-07-29 DIAGNOSIS — G4733 Obstructive sleep apnea (adult) (pediatric): Secondary | ICD-10-CM

## 2018-07-29 DIAGNOSIS — Z7982 Long term (current) use of aspirin: Secondary | ICD-10-CM | POA: Diagnosis not present

## 2018-07-29 DIAGNOSIS — R933 Abnormal findings on diagnostic imaging of other parts of digestive tract: Secondary | ICD-10-CM | POA: Diagnosis not present

## 2018-07-29 DIAGNOSIS — E785 Hyperlipidemia, unspecified: Secondary | ICD-10-CM | POA: Diagnosis present

## 2018-07-29 DIAGNOSIS — N400 Enlarged prostate without lower urinary tract symptoms: Secondary | ICD-10-CM | POA: Diagnosis present

## 2018-07-29 DIAGNOSIS — F329 Major depressive disorder, single episode, unspecified: Secondary | ICD-10-CM | POA: Diagnosis present

## 2018-07-29 DIAGNOSIS — K219 Gastro-esophageal reflux disease without esophagitis: Secondary | ICD-10-CM | POA: Diagnosis present

## 2018-07-29 DIAGNOSIS — K8689 Other specified diseases of pancreas: Secondary | ICD-10-CM | POA: Diagnosis present

## 2018-07-29 DIAGNOSIS — K828 Other specified diseases of gallbladder: Secondary | ICD-10-CM | POA: Diagnosis not present

## 2018-07-29 DIAGNOSIS — D649 Anemia, unspecified: Secondary | ICD-10-CM | POA: Diagnosis present

## 2018-07-29 DIAGNOSIS — F17210 Nicotine dependence, cigarettes, uncomplicated: Secondary | ICD-10-CM | POA: Diagnosis present

## 2018-07-29 DIAGNOSIS — E7800 Pure hypercholesterolemia, unspecified: Secondary | ICD-10-CM | POA: Diagnosis present

## 2018-07-29 DIAGNOSIS — F410 Panic disorder [episodic paroxysmal anxiety] without agoraphobia: Secondary | ICD-10-CM | POA: Diagnosis present

## 2018-07-29 DIAGNOSIS — C801 Malignant (primary) neoplasm, unspecified: Secondary | ICD-10-CM | POA: Diagnosis not present

## 2018-07-29 DIAGNOSIS — C250 Malignant neoplasm of head of pancreas: Secondary | ICD-10-CM | POA: Diagnosis not present

## 2018-07-29 DIAGNOSIS — H9191 Unspecified hearing loss, right ear: Secondary | ICD-10-CM | POA: Diagnosis present

## 2018-07-29 DIAGNOSIS — Z96653 Presence of artificial knee joint, bilateral: Secondary | ICD-10-CM | POA: Diagnosis present

## 2018-07-29 DIAGNOSIS — R17 Unspecified jaundice: Secondary | ICD-10-CM | POA: Diagnosis not present

## 2018-07-29 DIAGNOSIS — K5730 Diverticulosis of large intestine without perforation or abscess without bleeding: Secondary | ICD-10-CM | POA: Diagnosis not present

## 2018-07-29 DIAGNOSIS — R531 Weakness: Secondary | ICD-10-CM | POA: Diagnosis not present

## 2018-07-29 DIAGNOSIS — C259 Malignant neoplasm of pancreas, unspecified: Secondary | ICD-10-CM | POA: Diagnosis not present

## 2018-07-29 DIAGNOSIS — Z96611 Presence of right artificial shoulder joint: Secondary | ICD-10-CM | POA: Diagnosis present

## 2018-07-29 DIAGNOSIS — F419 Anxiety disorder, unspecified: Secondary | ICD-10-CM | POA: Diagnosis not present

## 2018-07-29 DIAGNOSIS — R740 Nonspecific elevation of levels of transaminase and lactic acid dehydrogenase [LDH]: Secondary | ICD-10-CM | POA: Diagnosis not present

## 2018-07-29 DIAGNOSIS — D490 Neoplasm of unspecified behavior of digestive system: Secondary | ICD-10-CM | POA: Diagnosis not present

## 2018-07-29 HISTORY — DX: Dependence on other enabling machines and devices: Z99.89

## 2018-07-29 HISTORY — DX: Obstructive sleep apnea (adult) (pediatric): G47.33

## 2018-07-29 MED ORDER — POLYETHYLENE GLYCOL 3350 17 G PO PACK
17.0000 g | PACK | Freq: Two times a day (BID) | ORAL | Status: DC
  Administered 2018-07-30 – 2018-07-31 (×2): 17 g via ORAL
  Filled 2018-07-29 (×2): qty 1

## 2018-07-29 MED ORDER — TAMSULOSIN HCL 0.4 MG PO CAPS
0.4000 mg | ORAL_CAPSULE | Freq: Every day | ORAL | Status: DC
  Administered 2018-07-29 – 2018-07-30 (×2): 0.4 mg via ORAL
  Filled 2018-07-29 (×2): qty 1

## 2018-07-29 MED ORDER — TIZANIDINE HCL 4 MG PO TABS
4.0000 mg | ORAL_TABLET | Freq: Three times a day (TID) | ORAL | Status: DC | PRN
  Filled 2018-07-29: qty 1

## 2018-07-29 MED ORDER — PAROXETINE HCL 10 MG PO TABS: 10.0000 mg | ORAL_TABLET | Freq: Every morning | ORAL | Status: DC

## 2018-07-29 MED ORDER — VENLAFAXINE HCL ER 75 MG PO CP24
75.0000 mg | ORAL_CAPSULE | Freq: Every day | ORAL | Status: DC
  Administered 2018-07-31: 75 mg via ORAL
  Filled 2018-07-29 (×2): qty 1

## 2018-07-29 MED ORDER — SODIUM CHLORIDE 0.9 % IV SOLN
INTRAVENOUS | Status: AC
  Administered 2018-07-29: 22:00:00 via INTRAVENOUS

## 2018-07-29 MED ORDER — OXYCODONE HCL 5 MG PO TABS
5.0000 mg | ORAL_TABLET | ORAL | Status: DC | PRN
  Administered 2018-07-30: 10 mg via ORAL
  Filled 2018-07-29: qty 2

## 2018-07-29 MED ORDER — PAROXETINE HCL 20 MG PO TABS: 40.0000 mg | ORAL_TABLET | Freq: Every morning | ORAL | Status: DC

## 2018-07-29 MED ORDER — CLONAZEPAM 0.5 MG PO TABS
0.5000 mg | ORAL_TABLET | Freq: Every day | ORAL | Status: DC | PRN
  Administered 2018-07-29 – 2018-07-30 (×2): 0.5 mg via ORAL
  Filled 2018-07-29 (×2): qty 1

## 2018-07-29 MED ORDER — TRAZODONE HCL 50 MG PO TABS
50.0000 mg | ORAL_TABLET | Freq: Every day | ORAL | Status: DC
  Administered 2018-07-29: 50 mg via ORAL
  Filled 2018-07-29: qty 1

## 2018-07-29 MED ORDER — ONDANSETRON HCL 4 MG/2ML IJ SOLN
4.0000 mg | Freq: Four times a day (QID) | INTRAMUSCULAR | Status: DC | PRN
  Administered 2018-07-29: 4 mg via INTRAVENOUS
  Filled 2018-07-29: qty 2

## 2018-07-29 MED ORDER — ONDANSETRON HCL 4 MG PO TABS: 4.0000 mg | ORAL_TABLET | Freq: Four times a day (QID) | ORAL | Status: DC | PRN

## 2018-07-29 MED ORDER — NAPHAZOLINE-GLYCERIN 0.012-0.2 % OP SOLN
1.0000 [drp] | Freq: Four times a day (QID) | OPHTHALMIC | Status: DC | PRN
  Filled 2018-07-29: qty 15

## 2018-07-29 MED ORDER — NICOTINE 21 MG/24HR TD PT24
21.0000 mg | MEDICATED_PATCH | Freq: Every day | TRANSDERMAL | Status: DC
  Administered 2018-07-29 – 2018-07-31 (×2): 21 mg via TRANSDERMAL
  Filled 2018-07-29 (×2): qty 1

## 2018-07-29 MED ORDER — ACETAMINOPHEN 650 MG RE SUPP: 650.0000 mg | Freq: Four times a day (QID) | RECTAL | Status: DC | PRN

## 2018-07-29 MED ORDER — SODIUM CHLORIDE 0.9 % IV SOLN
INTRAVENOUS | Status: DC
  Administered 2018-07-30: 12:00:00 via INTRAVENOUS

## 2018-07-29 MED ORDER — ACETAMINOPHEN 325 MG PO TABS: 650.0000 mg | ORAL_TABLET | Freq: Four times a day (QID) | ORAL | Status: DC | PRN

## 2018-07-29 MED ORDER — HYDRALAZINE HCL 20 MG/ML IJ SOLN: 10.0000 mg | INTRAMUSCULAR | Status: DC | PRN

## 2018-07-29 MED ORDER — NICOTINE POLACRILEX 2 MG MT GUM
2.0000 mg | CHEWING_GUM | OROMUCOSAL | Status: DC | PRN
  Administered 2018-07-30: 2 mg via ORAL
  Filled 2018-07-29 (×3): qty 1

## 2018-07-29 MED ORDER — ADULT MULTIVITAMIN W/MINERALS CH
1.0000 | ORAL_TABLET | Freq: Every morning | ORAL | Status: DC
  Administered 2018-07-31: 1 via ORAL
  Filled 2018-07-29: qty 1

## 2018-07-29 MED ORDER — AMITRIPTYLINE HCL 50 MG PO TABS
50.0000 mg | ORAL_TABLET | Freq: Every day | ORAL | Status: DC
  Administered 2018-07-30: 50 mg via ORAL
  Filled 2018-07-29: qty 1

## 2018-07-29 MED ORDER — DOCUSATE SODIUM 100 MG PO CAPS
100.0000 mg | ORAL_CAPSULE | Freq: Two times a day (BID) | ORAL | Status: DC
  Administered 2018-07-30 – 2018-07-31 (×2): 100 mg via ORAL
  Filled 2018-07-29 (×2): qty 1

## 2018-07-29 NOTE — H&P (Signed)
History and Physical    Nathaniel Lee:[id number] DOB: 1946/11/01 DOA: 07/29/2018  PCP: Angelina Sheriff, MD   Patient coming from: Home, by way of [facility]   Chief Complaint: Painless jaundice   HPI: Nathaniel Lee is a 72 y.o. male with medical history significant for tobacco abuse, hypertension, hyperlipidemia, and anxiety, now presenting to the emergency department for evaluation of painless jaundice and marked hyperbilirubinemia on outpatient blood work.  Patient reports that he usually enjoys good health, exercises 6-7 times a week, and had remained in his usual state until he noted the insidious development of jaundice over the past 2 weeks.  He was not experiencing any pain, nausea, or vomiting, and also denies any fevers, chills, chest pain, or shortness of breath.  He saw his PCP for evaluation of this, blood work was performed, he was noted to have total bilirubin in the 17 range, and was directed to the ED for further evaluation.  ED Course: Upon arrival to the ED, patient is found to be afebrile, saturating well on room air, and with vitals otherwise normal.  EKG features a sinus rhythm and chest x-ray is negative for acute cardiopulmonary disease.  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  Chemistry panel reveals alkaline phosphatase 429, AST 114, ALT 150, and total bilirubin of 17.8.  CBC is unremarkable, lactic acid is normal, INR is 1.2, and urinalysis with 2+ bilirubin.  Patient was given a liter of normal saline and gastroenterology at [facility] was consulted.  GI recommends a medical admission with plans for likely ERCP and biliary stenting.  Review of Systems:  All other systems reviewed and apart from HPI, are negative.  Past Medical History:  Diagnosis Date  . Anxiety    h/o panic attacks - related to anxiety   .  Arthritis    R shoulder, both knees  . Deafness in right ear   . Depression   . GERD (gastroesophageal reflux disease)    food related  . History of bronchitis   . Hypercholesteremia   . Hypertension    ekg- last one done 2010 at Dr. Milinda Cave office- [place] , sees Dr. Delena Bali- [facility] now for PCP, [place].     Past Surgical History:  Procedure Laterality Date  . CARPAL TUNNEL RELEASE     L hand - poor results, per pt. - 2012  . TOTAL KNEE ARTHROPLASTY Left 05/29/2013   Procedure: LEFT TOTAL KNEE ARTHROPLASTY;  Surgeon: Mauri Pole, MD;  Location: [facility];  Service: Orthopedics;  Laterality: Left;  . TOTAL KNEE ARTHROPLASTY Right 07/03/2013   Procedure: RIGHT TOTAL KNEE ARTHROPLASTY;  Surgeon: Mauri Pole, MD;  Location: [facility];  Service: Orthopedics;  Laterality: Right;  . TOTAL SHOULDER ARTHROPLASTY  04/13/2012   Procedure: TOTAL SHOULDER ARTHROPLASTY;  Surgeon: Marin Shutter, MD;  Location: [place];  Service: Orthopedics;  Laterality: Right;  Right total shoulder     reports that he has been smoking cigarettes. He has a 37.50 pack-year smoking history. He has never used smokeless tobacco. He reports that he does not drink alcohol or use drugs.  No Known Allergies  Family History  Problem Relation Age of Onset  . Breast cancer Mother   . Anesthesia problems Neg Hx      Prior to Admission medications   Medication Sig Start Date End Date  Taking? Authorizing Provider  clonazePAM (KLONOPIN) 0.5 MG tablet Take 0.5 mg by mouth daily as needed (For acute stress.).     [provider]  Cyanocobalamin (VITAMIN B 12 PO) Take 1 tablet by mouth every morning.     [provider]  docusate sodium 100 MG CAPS Take 100 mg by mouth 2 (two) times daily. 07/04/13   Danae Orleans, PA-C  ferrous sulfate 325 (65 FE) MG tablet Take 1 tablet (325 mg total) by mouth 3 (three) times daily after meals. 05/30/13   Danae Orleans, PA-C  lisinopril (PRINIVIL,ZESTRIL) 40 MG  tablet Take 40 mg by mouth at bedtime.    [provider]  Multiple Vitamin (MULTIVITAMIN WITH MINERALS) TABS Take 1 tablet by mouth every morning.    [provider]  niacin 500 MG tablet Take 500 mg by mouth daily with breakfast.    [provider]  oxyCODONE (OXY IR/ROXICODONE) 5 MG immediate release tablet Take 1-3 tablets (5-15 mg total) by mouth every 4 (four) hours as needed for pain. 07/04/13   Danae Orleans, PA-C  PARoxetine (PAXIL) 10 MG tablet Take 10 mg by mouth every morning. He takes with a 40mg  tablet to equal his dose of 50mg .    [provider]  PARoxetine (PAXIL) 40 MG tablet Take 40 mg by mouth every morning. He takes with a 10mg  tablet to equal his dose of 50mg .    [provider]  polyethylene glycol (MIRALAX / GLYCOLAX) packet Take 17 g by mouth 2 (two) times daily. 07/04/13   Danae Orleans, PA-C  pravastatin (PRAVACHOL) 20 MG tablet Take 20 mg by mouth at bedtime.    [provider]  Tamsulosin HCl (FLOMAX) 0.4 MG CAPS Take 0.4 mg by mouth at bedtime.    [provider]  tetrahydrozoline 0.05 % ophthalmic solution Place 1 drop into both eyes daily as needed (For eye redness.).    [provider]  tiZANidine (ZANAFLEX) 4 MG capsule Take 1 capsule (4 mg total) by mouth 3 (three) times daily as needed for muscle spasms. 07/04/13   Danae Orleans, PA-C  traZODone (DESYREL) 50 MG tablet Take 50 mg by mouth at bedtime.    [provider]    Physical Exam: Vitals:   07/29/18 1825  BP: (!) 161/77  Pulse: 64  Resp: 14  Temp: 97.7 F (36.5 C)  TempSrc: Axillary  SpO2: 100%  Weight: 80.2 kg  Height: 5\' 5"  (1.651 m)      Constitutional: NAD, calm  Eyes: PERTLA, lids and conjunctivae normal ENMT: Mucous membranes are moist. Posterior pharynx clear of any exudate or lesions.   Neck: normal, supple, no masses, no thyromegaly Respiratory: clear to auscultation bilaterally, no wheezing, no  crackles. Normal respiratory effort.   Cardiovascular: S1 & S2 heard, regular rate and rhythm. No extremity edema.   Abdomen: No distension, no tenderness, soft. Bowel sounds active.  Musculoskeletal: no clubbing / cyanosis. No joint deformity upper and lower extremities.   Skin: Jaundice. Warm, dry, well-perfused. Neurologic: CN 2-12 grossly intact. Sensation intact. Strength 5/5 in all 4 limbs.  Psychiatric: Alert and oriented x 3. Pleasant, cooperative.     Labs on Admission: I have personally reviewed following labs and imaging studies  CBC: No results for input(s): WBC, NEUTROABS, HGB, HCT, MCV, PLT in the last 168 hours. Basic Metabolic Panel: No results for input(s): NA, K, CL, CO2, GLUCOSE, BUN, CREATININE, CALCIUM, MG, PHOS in the last 168 hours. GFR: CrCl cannot be calculated (  Patient's most recent lab result is older than the maximum 21 days allowed.). Liver Function Tests: No results for input(s): AST, ALT, ALKPHOS, BILITOT, PROT, ALBUMIN in the last 168 hours. No results for input(s): LIPASE, AMYLASE in the last 168 hours. No results for input(s): AMMONIA in the last 168 hours. Coagulation Profile: No results for input(s): INR, PROTIME in the last 168 hours. Cardiac Enzymes: No results for input(s): CKTOTAL, CKMB, CKMBINDEX, TROPONINI in the last 168 hours. BNP (last 3 results) No results for input(s): PROBNP in the last 8760 hours. HbA1C: No results for input(s): HGBA1C in the last 72 hours. CBG: No results for input(s): GLUCAP in the last 168 hours. Lipid Profile: No results for input(s): CHOL, HDL, LDLCALC, TRIG, CHOLHDL, LDLDIRECT in the last 72 hours. Thyroid Function Tests: No results for input(s): TSH, T4TOTAL, FREET4, T3FREE, THYROIDAB in the last 72 hours. Anemia Panel: No results for input(s): VITAMINB12, FOLATE, FERRITIN, TIBC, IRON, RETICCTPCT in the last 72 hours. Urine analysis:    Component Value Date/Time   COLORURINE YELLOW 06/27/2013 [place] 06/27/2013 1353   LABSPEC 1.020 06/27/2013 1353   PHURINE 6.0 06/27/2013 1353   GLUCOSEU NEGATIVE 06/27/2013 1353   HGBUR NEGATIVE 06/27/2013 1353   BILIRUBINUR NEGATIVE 06/27/2013 1353   KETONESUR NEGATIVE 06/27/2013 1353   PROTEINUR NEGATIVE 06/27/2013 1353   UROBILINOGEN 0.2 06/27/2013 1353   NITRITE NEGATIVE 06/27/2013 1353   LEUKOCYTESUR NEGATIVE 06/27/2013 1353   Sepsis Labs: @LABRCNTIP (procalcitonin:4,lacticidven:4) )No results found for this or any previous visit (from the past 240 hour(s)).   Radiological Exams on Admission: No results found.  EKG: Independently reviewed. Sinus rhythm, rate 66, QTc 441.   Assessment/Plan   1. Pancreatic mass with biliary obstruction  - Saw PCP for eval of painless jaundice, found to have marked elevation in total bili and directed to ED where CT reveals mass at pancreatic head/uncinate with biliary obstruction  - GI is consulting and much appreciated, planning for ERCP with stent placement  - Clear liquids for now, NPO after midnight, continue supportive care    2. Hypertension  - BP at goal  - Use hydralazine IVP's as needed for now   3. Anxiety  - Continue Paxil, prn Klonopin    4. OSA  - Continue CPAP qHS     DVT prophylaxis: SCD's  Code Status: Full  Family Communication: Brother updated at bedside  Consults called: GI  Admission status: Inpatient     Vianne Bulls, MD [facility] Pager [phone]  If 7PM-7AM, please contact night-coverage [email or web address] Password [facility]  07/29/2018, 8:17 PM

## 2018-07-29 NOTE — H&P (View-Only) (Signed)
Reason for Consult: Malignant biliary obstruction Referring Physician: [facility]  Laurann Montana D [facility] HPI: This is a 72 year old male with a PMH of HTN, GERD, hyperlipidemia, and deafness in the right ear is admitted for painless jaundice.  His symptoms started one week ago and it was associated with diarrhea and an 8 lbs weight loss.  He did not personally notice the change, but his family saw that he was jaundice.  He sought care from his PCP and his blood work showed that he had a bilirubin at 18.  Per his PCP's instructions he was told to present to the ER.  He was first evaluated at [facility] ER and his blood work showed the following:  AST 114, ALT 150, AP 429, TB 17.8, INR 1.2, WBC 6.8, HGB 13.5, and platelet 182.  He has an 80+ year history of smoking.  The CT scan showed a mass measuring 2.1 x 2.3 cm in the uncinate process/distal head of the pancreas.  The CBD is dilated to 1.5 cm and there is encasement of the SMA.  He was transferred to [facility] for further evaluation and treatment.  Past Medical History:  Diagnosis Date  . Anxiety    h/o panic attacks - related to anxiety   . Arthritis    R shoulder, both knees  . Deafness in right ear   . Depression   . GERD (gastroesophageal reflux disease)    food related  . History of bronchitis   . Hypercholesteremia   . Hypertension    ekg- last one done 2010 at Dr. Milinda Cave office- [place] , sees Dr. Delena Bali- [facility] now for PCP, [place].     Past Surgical History:  Procedure Laterality Date  . CARPAL TUNNEL RELEASE     L hand - poor results, per pt. - 2012  . TOTAL KNEE ARTHROPLASTY Left 05/29/2013   Procedure: LEFT TOTAL KNEE ARTHROPLASTY;  Surgeon: Mauri Pole, MD;  [facility];  Service: Orthopedics;  Laterality: Left;  . TOTAL KNEE ARTHROPLASTY Right 07/03/2013   Procedure: RIGHT TOTAL KNEE ARTHROPLASTY;  Surgeon: Mauri Pole, MD;  [facility];  Service: Orthopedics;  Laterality: Right;  .  TOTAL SHOULDER ARTHROPLASTY  04/13/2012   Procedure: TOTAL SHOULDER ARTHROPLASTY;  Surgeon: Marin Shutter, MD;  Location: [place];  Service: Orthopedics;  Laterality: Right;  Right total shoulder    Family History  Problem Relation Age of Onset  . Anesthesia problems Neg Hx     Social History:  reports that he has been smoking cigarettes. He has a 37.50 pack-year smoking history. He has never used smokeless tobacco. He reports that he does not drink alcohol or use drugs.  Allergies: No Known Allergies  Medications: Scheduled: Continuous:  No results found for this or any previous visit (from the past 24 hour(s)).   No results found.  ROS:  As stated above in the HPI otherwise negative.  Blood pressure (!) 161/77, pulse 64, temperature 97.7 F (36.5 C), temperature source Axillary, resp. rate 14, height 5\' 5"  (1.651 m), weight 80.2 kg, SpO2 100 %.    PE: Gen: NAD, Alert and Oriented, jaundiced HEENT:  [place]/AT, EOMI Neck: Supple, no LAD Lungs: CTA Bilaterally CV: RRR without M/G/R ABM: Soft, NTND, obese, +BS Ext: No C/C/E  Assessment/Plan: 1) Probable pancreatic cancer. 2) Malignant biliary obstruction. 3) HTN. 4) Hyperlipidemia.   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  He is not febrile and there is no indication that he has a cholangitis.  Further treatment is necessary with stent placement of the bile duct.  There is also evidence of pancreatic insufficiency.  He will be started on pancreatic enzymes once he can be placed back on a diet.  Plan: 1) ERCP with stent placement tomorrow. 2) Clear liquids and then NPO after midnight.  [name] 07/29/2018, 7:34 PM

## 2018-07-29 NOTE — Consult Note (Signed)
Reason for Consult: Malignant biliary obstruction Referring Physician: [facility]  Nathaniel Lee HPI: This is a 72 year old male with a PMH of HTN, GERD, hyperlipidemia, and deafness in the right ear is admitted for painless jaundice.  His symptoms started one week ago and it was associated with diarrhea and an 8 lbs weight loss.  He did not personally notice the change, but his family saw that he was jaundice.  He sought care from his PCP and his blood work showed that he had a bilirubin at 18.  Per his PCP's instructions he was told to present to the ER.  He was first evaluated at [facility] ER and his blood work showed the following:  AST 114, ALT 150, AP 429, TB 17.8, INR 1.2, WBC 6.8, HGB 13.5, and platelet 182.  He has an 80+ year history of smoking.  The CT scan showed a mass measuring 2.1 x 2.3 cm in the uncinate process/distal head of the pancreas.  The CBD is dilated to 1.5 cm and there is encasement of the SMA.  He was transferred to [facility] for further evaluation and treatment.  Past Medical History:  Diagnosis Date  . Anxiety    h/o panic attacks - related to anxiety   . Arthritis    R shoulder, both knees  . Deafness in right ear   . Depression   . GERD (gastroesophageal reflux disease)    food related  . History of bronchitis   . Hypercholesteremia   . Hypertension    ekg- last one done 2010 at Dr. Milinda Cave office- [place] , sees Dr. Delena Bali- [facility] now for PCP, [place].     Past Surgical History:  Procedure Laterality Date  . CARPAL TUNNEL RELEASE     L hand - poor results, per pt. - 2012  . TOTAL KNEE ARTHROPLASTY Left 05/29/2013   Procedure: LEFT TOTAL KNEE ARTHROPLASTY;  Surgeon: Mauri Pole, MD;  [facility];  Service: Orthopedics;  Laterality: Left;  . TOTAL KNEE ARTHROPLASTY Right 07/03/2013   Procedure: RIGHT TOTAL KNEE ARTHROPLASTY;  Surgeon: Mauri Pole, MD;  [facility];  Service: Orthopedics;  Laterality: Right;  .  TOTAL SHOULDER ARTHROPLASTY  04/13/2012   Procedure: TOTAL SHOULDER ARTHROPLASTY;  Surgeon: Marin Shutter, MD;  Location: [place];  Service: Orthopedics;  Laterality: Right;  Right total shoulder    Family History  Problem Relation Age of Onset  . Anesthesia problems Neg Hx     Social History:  reports that he has been smoking cigarettes. He has a 37.50 pack-year smoking history. He has never used smokeless tobacco. He reports that he does not drink alcohol or use drugs.  Allergies: No Known Allergies  Medications: Scheduled: Continuous:  No results found for this or any previous visit (from the past 24 hour(s)).   No results found.  ROS:  As stated above in the HPI otherwise negative.  Blood pressure (!) 161/77, pulse 64, temperature 97.7 F (36.5 C), temperature source Axillary, resp. rate 14, height 5\' 5"  (1.651 m), weight 80.2 kg, SpO2 100 %.    PE: Gen: NAD, Alert and Oriented, jaundiced HEENT:  [place]/AT, EOMI Neck: Supple, no LAD Lungs: CTA Bilaterally CV: RRR without M/G/R ABM: Soft, NTND, obese, +BS Ext: No C/C/E  Assessment/Plan: 1) Probable pancreatic cancer. 2) Malignant biliary obstruction. 3) HTN. 4) Hyperlipidemia.   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  He is not febrile and there is no indication that he has a cholangitis.  Further treatment is necessary with stent placement of the bile duct.  There is also evidence of pancreatic insufficiency.  He will be started on pancreatic enzymes once he can be placed back on a diet.  Plan: 1) ERCP with stent placement tomorrow. 2) Clear liquids and then NPO after midnight.  Makaela Cando D 07/29/2018, 7:34 PM

## 2018-07-30 ENCOUNTER — Inpatient Hospital Stay (HOSPITAL_COMMUNITY): Payer: Medicare Other | Admitting: Anesthesiology

## 2018-07-30 ENCOUNTER — Inpatient Hospital Stay (HOSPITAL_COMMUNITY): Payer: Medicare Other

## 2018-07-30 ENCOUNTER — Encounter (HOSPITAL_COMMUNITY)
Admission: AD | Disposition: A | Discharge status: Home / Self Care | Payer: Self-pay | Source: Other Acute Inpatient Hospital | Attending: Internal Medicine

## 2018-07-30 ENCOUNTER — Encounter (HOSPITAL_COMMUNITY): Payer: Self-pay

## 2018-07-30 DIAGNOSIS — C801 Malignant (primary) neoplasm, unspecified: Secondary | ICD-10-CM

## 2018-07-30 HISTORY — PX: ERCP: SHX5425

## 2018-07-30 HISTORY — PX: SPHINCTEROTOMY: SHX5544

## 2018-07-30 HISTORY — PX: BILIARY STENT PLACEMENT: SHX5538

## 2018-07-30 HISTORY — DX: Malignant (primary) neoplasm, unspecified: C80.1

## 2018-07-30 LAB — COMPREHENSIVE METABOLIC PANEL
ALBUMIN: 2.8 g/dL — AB (ref 3.5–5.0)
ALK PHOS: 370 U/L — AB (ref 38–126)
ALT: 124 U/L — ABNORMAL HIGH (ref 0–44)
ANION GAP: 9 (ref 5–15)
AST: 99 U/L — AB (ref 15–41)
BILIRUBIN TOTAL: 19.5 mg/dL — AB (ref 0.3–1.2)
BUN: 7 mg/dL — ABNORMAL LOW (ref 8–23)
CALCIUM: 8.9 mg/dL (ref 8.9–10.3)
CO2: 23 mmol/L (ref 22–32)
CREATININE: 0.75 mg/dL (ref 0.61–1.24)
Chloride: 107 mmol/L (ref 98–111)
GFR calc Af Amer: >60 mL/min (ref >60)
GFR calc non Af Amer: >60 mL/min (ref >60)
Glucose, Bld: 152 mg/dL — ABNORMAL HIGH (ref 70–99)
Potassium: 3.8 mmol/L (ref 3.5–5.1)
SODIUM: 139 mmol/L (ref 135–145)
TOTAL PROTEIN: 5.9 g/dL — AB (ref 6.5–8.1)

## 2018-07-30 LAB — CBC WITH DIFFERENTIAL/PLATELET
Abs Immature Granulocytes: 0 10*3/uL (ref 0.0–0.1)
BASOS ABS: 0 10*3/uL (ref 0.0–0.1)
BASOS PCT: 0 %
EOS ABS: 0.1 10*3/uL (ref 0.0–0.7)
Eosinophils Relative: 1 %
HCT: 36.5 % — ABNORMAL LOW (ref 39.0–52.0)
Hemoglobin: 12.4 g/dL — ABNORMAL LOW (ref 13.0–17.0)
IMMATURE GRANULOCYTES: 0 %
Lymphocytes Relative: 20 %
Lymphs Abs: 1.4 10*3/uL (ref 0.7–4.0)
MCH: 31.8 pg (ref 26.0–34.0)
MCHC: 34 g/dL (ref 30.0–36.0)
MCV: 93.6 fL (ref 78.0–100.0)
MONOS PCT: 7 %
Monocytes Absolute: 0.5 10*3/uL (ref 0.1–1.0)
NEUTROS ABS: 4.9 10*3/uL (ref 1.7–7.7)
NEUTROS PCT: 72 %
PLATELETS: 189 10*3/uL (ref 150–400)
RBC: 3.9 MIL/uL — ABNORMAL LOW (ref 4.22–5.81)
RDW: 18.6 % — AB (ref 11.5–15.5)
WBC: 6.9 10*3/uL (ref 4.0–10.5)

## 2018-07-30 LAB — PROTIME-INR
INR: 1.27
PROTHROMBIN TIME: 15.8 s — AB (ref 11.4–15.2)

## 2018-07-30 LAB — SURGICAL PCR SCREEN
MRSA, PCR: NEGATIVE
STAPHYLOCOCCUS AUREUS: POSITIVE — AB

## 2018-07-30 LAB — GLUCOSE, CAPILLARY: Glucose-Capillary: 156 mg/dL — ABNORMAL HIGH (ref 70–99)

## 2018-07-30 SURGERY — ERCP, WITH INTERVENTION IF INDICATED
Anesthesia: General

## 2018-07-30 MED ORDER — SODIUM CHLORIDE 0.9 % IV SOLN
INTRAVENOUS | Status: DC | PRN
  Administered 2018-07-30: 30 mL

## 2018-07-30 MED ORDER — CIPROFLOXACIN IN D5W 400 MG/200ML IV SOLN
INTRAVENOUS | Status: DC | PRN
  Administered 2018-07-30: 400 mg via INTRAVENOUS

## 2018-07-30 MED ORDER — INDOMETHACIN 50 MG RE SUPP
RECTAL | Status: AC
  Filled 2018-07-30: qty 2

## 2018-07-30 MED ORDER — PROPOFOL 10 MG/ML IV BOLUS
INTRAVENOUS | Status: DC | PRN
  Administered 2018-07-30: 130 mg via INTRAVENOUS
  Administered 2018-07-30 (×3): 20 mg via INTRAVENOUS

## 2018-07-30 MED ORDER — ESMOLOL HCL 100 MG/10ML IV SOLN
INTRAVENOUS | Status: DC | PRN
  Administered 2018-07-30: 5 mg via INTRAVENOUS

## 2018-07-30 MED ORDER — LIDOCAINE 2% (20 MG/ML) 5 ML SYRINGE
INTRAMUSCULAR | Status: AC
  Filled 2018-07-30: qty 5

## 2018-07-30 MED ORDER — INDOMETHACIN 50 MG RE SUPP
RECTAL | Status: DC | PRN
  Administered 2018-07-30: 100 mg via RECTAL

## 2018-07-30 MED ORDER — FENTANYL CITRATE (PF) 250 MCG/5ML IJ SOLN
INTRAMUSCULAR | Status: AC
  Filled 2018-07-30: qty 5

## 2018-07-30 MED ORDER — ONDANSETRON HCL 4 MG/2ML IJ SOLN
INTRAMUSCULAR | Status: DC | PRN
  Administered 2018-07-30: 4 mg via INTRAVENOUS

## 2018-07-30 MED ORDER — GLUCAGON HCL RDNA (DIAGNOSTIC) 1 MG IJ SOLR
INTRAMUSCULAR | Status: AC
  Filled 2018-07-30: qty 1

## 2018-07-30 MED ORDER — IOPAMIDOL (ISOVUE-300) INJECTION 61%
INTRAVENOUS | Status: AC
  Filled 2018-07-30: qty 50

## 2018-07-30 MED ORDER — LACTATED RINGERS IV SOLN
INTRAVENOUS | Status: DC | PRN
  Administered 2018-07-30: 13:00:00 via INTRAVENOUS

## 2018-07-30 MED ORDER — LIDOCAINE HCL (CARDIAC) PF 100 MG/5ML IV SOSY
PREFILLED_SYRINGE | INTRAVENOUS | Status: DC | PRN
  Administered 2018-07-30: 50 mg via INTRAVENOUS

## 2018-07-30 MED ORDER — SODIUM CHLORIDE 0.9 % IV SOLN
INTRAVENOUS | Status: DC | PRN
  Administered 2018-07-30: 10 ug/min via INTRAVENOUS

## 2018-07-30 MED ORDER — PROPOFOL 10 MG/ML IV BOLUS
INTRAVENOUS | Status: AC
  Filled 2018-07-30: qty 20

## 2018-07-30 MED ORDER — ROCURONIUM BROMIDE 50 MG/5ML IV SOSY
PREFILLED_SYRINGE | INTRAVENOUS | Status: DC | PRN
  Administered 2018-07-30: 50 mg via INTRAVENOUS

## 2018-07-30 MED ORDER — DEXAMETHASONE SODIUM PHOSPHATE 10 MG/ML IJ SOLN
INTRAMUSCULAR | Status: DC | PRN
  Administered 2018-07-30: 10 mg via INTRAVENOUS

## 2018-07-30 MED ORDER — SUGAMMADEX SODIUM 200 MG/2ML IV SOLN
INTRAVENOUS | Status: DC | PRN
  Administered 2018-07-30: 160.4 mg via INTRAVENOUS

## 2018-07-30 MED ORDER — ALBUTEROL SULFATE (2.5 MG/3ML) 0.083% IN NEBU: 2.5000 mg | INHALATION_SOLUTION | Freq: Four times a day (QID) | RESPIRATORY_TRACT | Status: DC | PRN

## 2018-07-30 MED ORDER — CIPROFLOXACIN IN D5W 400 MG/200ML IV SOLN
INTRAVENOUS | Status: AC
  Filled 2018-07-30: qty 200

## 2018-07-30 MED ORDER — FENTANYL CITRATE (PF) 100 MCG/2ML IJ SOLN
INTRAMUSCULAR | Status: DC | PRN
  Administered 2018-07-30: 100 ug via INTRAVENOUS

## 2018-07-30 NOTE — Transfer of Care (Signed)
Immediate Anesthesia Transfer of Care Note  Patient: Nathaniel Lee  Procedure(s) Performed: ENDOSCOPIC RETROGRADE CHOLANGIOPANCREATOGRAPHY (ERCP) (N/A )  Patient Location: PACU  Anesthesia Type:General  Level of Consciousness: awake, alert , oriented and patient cooperative  Airway & Oxygen Therapy: Patient Spontanous Breathing and Patient connected to nasal cannula oxygen  Post-op Assessment: Report given to RN and Post -op Vital signs reviewed and stable  Post vital signs: Reviewed and stable  Last Vitals:  Vitals Value Taken Time  BP 123/69 07/30/2018  1:57 PM  Temp    Pulse 85 07/30/2018  1:59 PM  Resp 21 07/30/2018  1:59 PM  SpO2 97 % 07/30/2018  1:59 PM  Vitals shown include unvalidated device data.  Last Pain:  Vitals:   07/30/18 1218  TempSrc: Oral  PainSc: 0-No pain         Complications: No apparent anesthesia complications

## 2018-07-30 NOTE — Anesthesia Procedure Notes (Signed)
Procedure Name: Intubation Date/Time: 07/30/2018 12:59 PM Performed by: Adalberto Ill, CRNA Pre-anesthesia Checklist: Patient identified, Emergency Drugs available, Suction available, Patient being monitored and Timeout performed Patient Re-evaluated:Patient Re-evaluated prior to induction Oxygen Delivery Method: Circle system utilized Preoxygenation: Pre-oxygenation with 100% oxygen Induction Type: IV induction Ventilation: Mask ventilation without difficulty Laryngoscope Size: McGraph and 4 Grade View: Grade II Tube type: Oral Tube size: 7.5 mm Number of attempts: 1 Airway Equipment and Method: Stylet and Video-laryngoscopy Placement Confirmation: ETT inserted through vocal cords under direct vision,  positive ETCO2 and breath sounds checked- equal and bilateral Secured at: 22 cm Tube secured with: Tape Dental Injury: Teeth and Oropharynx as per pre-operative assessment

## 2018-07-30 NOTE — Interval H&P Note (Signed)
History and Physical Interval Note:  07/30/2018 12:21 PM  Nathaniel Lee  has presented today for surgery, with the diagnosis of Biliary obstruction  The various methods of treatment have been discussed with the patient and family. After consideration of risks, benefits and other options for treatment, the patient has consented to  Procedure(s): ENDOSCOPIC RETROGRADE CHOLANGIOPANCREATOGRAPHY (ERCP) (N/A) as a surgical intervention .  The patient's history has been reviewed, patient examined, no change in status, stable for surgery.  I have reviewed the patient's chart and labs.  Questions were answered to the patient's satisfaction.     Michele Kerlin D

## 2018-07-30 NOTE — Progress Notes (Signed)
PROGRESS NOTE    ARGIE APPLEGATE  TDV:[id number] DOB: 1945/12/20 DOA: 07/29/2018 PCP: Angelina Sheriff, MD   Brief Narrative:   HPI Per Dr. Mitzi Hansen on 07/29/18 Nathaniel Lee is a 72 y.o. male with medical history significant for tobacco abuse, hypertension, hyperlipidemia, and anxiety, now presenting to the emergency department for evaluation of painless jaundice and marked hyperbilirubinemia on outpatient blood work.  Patient reports that he usually enjoys good health, exercises 6-7 times a week, and had remained in his usual state until he noted the insidious development of jaundice over the past 2 weeks.  He was not experiencing any pain, nausea, or vomiting, and also denies any fevers, chills, chest pain, or shortness of breath.  He saw his PCP for evaluation of this, blood work was performed, he was noted to have total bilirubin in the 17 range, and was directed to the ED for further evaluation.  ED Course: Upon arrival to the ED, patient is found to be afebrile, saturating well on room air, and with vitals otherwise normal.  EKG features a sinus rhythm and chest x-ray is negative for acute cardiopulmonary disease.  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  Chemistry panel reveals alkaline phosphatase 429, AST 114, ALT 150, and total bilirubin of 17.8.  CBC is unremarkable, lactic acid is normal, INR is 1.2, and urinalysis with 2+ bilirubin.  Patient was given a liter of normal saline and gastroenterology at [facility] was consulted.  GI recommends a medical admission with plans for likely ERCP and biliary stenting.  **Patient underwent ERCP and Biliary Stenting today by Dr. Carol Ada. Diet was advanced to Regular Diet.  Assessment & Plan:   Principal Problem:   Biliary obstruction Active Problems:   Pancreatic mass   Anxiety   Essential  hypertension   OSA on CPAP   Malignant biliary obstruction (HCC)  Pancreatic mass with biliary obstruction resulting in Hyperbilirubinemia and Abnormal LFTs  -Saw PCP for eval of painless jaundice, found to have marked elevation in total bili and directed to ED where CT reveals mass at pancreatic head/uncinate with biliary obstruction  - GI is consulting and much appreciated and preformed ERCP with stent placement today -She was initially placed on a clear liquid diet however now was placed on a regular diet after his ERCP--CA-19-9 is being ordered and pending -Continue supportive care and was given normal saline at rate of 90 and mils per hour which is now stopped -GI recommending EUS on Friday -T bili was 19.5 on admission to our hospital -Continue to monitor and trend after stent placement  Hypertension  - BP at goal  - Use hydralazine IVP's as needed for now and hold his home lisinopril 40 mg p.o.  daily  Depression and Anxiety  with history of panic attacks -Continue with clonazepam 0.5 mill grams p.o. daily as needed for acute chest and continue with amitriptyline 50 mg p.o. nightly along with venlafaxine XR 75 mg p.o. with breakfast -Also takes Hydroxyzine 50 mg po PRN Daily but will hold currently  OSA  -Continue CPAP qHS    Tobacco Abuse -Smoking cessation counseling given -Continue with nicotine patch 21 mg transdermally every 24 hours as well as nicotine polacrilex gum 2 mg po PRN for Smoking Cessation   Hyperlipidemia/Hypercholesterolemia -Currently holding pravastatin 20 mg p.o. Nightly given elevated LFTs -Continue to Monitor and Trend  BPH -Continue with Tamsulosin 0.4 mg p.o. Nightly  GERD -Not on anh Home Medications  History of Bronchitis -Added Albuterol Nebs 2.5 mg q6hprn for Wheezing and SOB  Normocytic Anemia  -Patient takes Iron Supplementation at home but will hold currently -Hb/Hct currently 12.4/36.5 -Continue to Monitor for S/Sx of  Bleeding -Repeat CBC in AM   DVT prophylaxis: SCDs Code Status: FULL CODE Family Communication: No family present at bedside  Disposition Plan: [place] for further workup and evaluation  Consultants:  Dr. Benson Norway of Gastroenterology    Procedures:  ERCP Findings:      The major papilla was normal. A short 0.035 inch Soft Jagwire was passed       into the biliary tree. A 5 mm biliary sphincterotomy was made with a       monofilament traction (standard) sphincterotome using ERBE       electrocautery. The sphincterotomy oozed blood. One 10 mm by 4 cm       covered metal stent with no external flaps and no internal flaps was       placed 4.5 cm into the common bile duct. Bile and sludge flowed through       the stent. The stent was in good position.      Looping was encountered with advancement of the duodenoscope from the       gastric lumen into the duodenal lumen. In the first and second portions       of the duodenum there was evidence of superficial scattered ulcerations.       The ampulla was identified hiding in some duodenal folds. During the       first attempt the CBD was successfully cannulated. The guidewire was       secured in the right intrahepatic ducts. Contrast injection revealed a       dilated CBD at 12-15 mm and there was a filling defect in the mid CBD       consistent with a stone. In the distal CBD There was evidence of a 3-4       cm stricture. The intrahepatic ducts also exhibited dilation. A 5-8 mm       sphincterotomy was created, but it was not extended further as the       adjacent duodenal folds were being cauterized with extension of the       sphincterotomy. Minor oozing was encountered. A 10 mm x 40 mm covered       metallic stent was inserted with ease and successfully deployed. A       copious amount of dark bile and sludge exited the stent. Impression:               - The major papilla appeared normal.                           - A biliary  sphincterotomy was performed.                           - One covered metal stent was placed into the                            common bile duct.   Antimicrobials:  Anti-infectives (From admission, onward)   None     Subjective: Seen and examined at bedside and patient states that he had  some abdominal pain today and he states that it is around his hernia.  No chest pain, lightheadedness or dizziness.  States that he has a family history of cancer that mother died of cancer 23 years ago.  No other concerns or complaints at this time and ready for ERCP  Objective: Vitals:   07/30/18 1357 07/30/18 1400 07/30/18 1412 07/30/18 1429  BP: 123/69  (!) 150/75 (!) 149/76  Pulse: 88 85 76 65  Resp: (!) 26 (!) 21 (!) 24 18  Temp: 97.7 F (36.5 C)  97.7 F (36.5 C) (!) 97.5 F (36.4 C)  TempSrc:    Oral  SpO2: 97% 97% 97% 99%  Weight:      Height:        Intake/Output Summary (Last 24 hours) at 07/30/2018 1518 Last data filed at 07/30/2018 1400 Gross per 24 hour  Intake 950 ml  Output 405 ml  Net 545 ml   Filed Weights   07/29/18 1825  Weight: 80.2 kg   Examination: Physical Exam:  Constitutional: WN/WD overweight Caucasian male in NAD and appears calm and comfortable Eyes: Lids are normal.  Sclera is severely icteric ENMT: External Ears, Nose appear normal.  Patient is very hard of hearing.   Neck: Appears normal, supple, no cervical masses, normal ROM, no appreciable thyromegaly, no JVD Respiratory: Diminished to auscultation bilaterally, no wheezing, rales, rhonchi or crackles. Normal respiratory effort and patient is not tachypenic. No accessory muscle use.  Cardiovascular: RRR, no murmurs / rubs / gallops. S1 and S2 auscultated. No extremity edema. Abdomen: Soft, tender to palpate, slightly distended.  Has an umbilical hernia noted bowel sounds positive x4.  GU: Deferred. Musculoskeletal: No clubbing / cyanosis of digits/nails. No joint deformity upper and lower  extremities. Skin: Patient is severely jaundiced and has a yellowish hue coloring of the skin.  No appreciable rashes or lesions limited skin evaluation no induration; Warm and dry.  Neurologic: CN 2-12 grossly intact with no focal deficits. Romberg sign and cerebellar reflexes not assessed.  Psychiatric: Normal judgment and insight. Alert and oriented x 3. Normal mood and appropriate affect.   Data Reviewed: I have personally reviewed following labs and imaging studies  CBC: Recent Labs  Lab 07/30/18 0615  WBC 6.9  NEUTROABS 4.9  HGB 12.4*  HCT 36.5*  MCV 93.6  PLT 676   Basic Metabolic Panel: Recent Labs  Lab 07/30/18 0615  NA 139  K 3.8  CL 107  CO2 23  GLUCOSE 152*  BUN 7*  CREATININE 0.75  CALCIUM 8.9   GFR: Estimated Creatinine Clearance: 81.5 mL/min (by C-G formula based on SCr of 0.75 mg/dL). Liver Function Tests: Recent Labs  Lab 07/30/18 0615  AST 99*  ALT 124*  ALKPHOS 370*  BILITOT 19.5*  PROT 5.9*  ALBUMIN 2.8*   No results for input(s): LIPASE, AMYLASE in the last 168 hours. No results for input(s): AMMONIA in the last 168 hours. Coagulation Profile: Recent Labs  Lab 07/30/18 0615  INR 1.27   Cardiac Enzymes: No results for input(s): CKTOTAL, CKMB, CKMBINDEX, TROPONINI in the last 168 hours. BNP (last 3 results) No results for input(s): PROBNP in the last 8760 hours. HbA1C: No results for input(s): HGBA1C in the last 72 hours. CBG: Recent Labs  Lab 07/30/18 0810  GLUCAP 156*   Lipid Profile: No results for input(s): CHOL, HDL, LDLCALC, TRIG, CHOLHDL, LDLDIRECT in the last 72 hours. Thyroid Function Tests: No results for input(s): TSH, T4TOTAL, FREET4, T3FREE, THYROIDAB in the  last 72 hours. Anemia Panel: No results for input(s): VITAMINB12, FOLATE, FERRITIN, TIBC, IRON, RETICCTPCT in the last 72 hours. Sepsis Labs: No results for input(s): PROCALCITON, LATICACIDVEN in the last 168 hours.  Recent Results (from the past 240 hour(s))   Surgical PCR screen     Status: Abnormal   Collection Time: 07/29/18 11:29 PM  Result Value Ref Range Status   MRSA, PCR NEGATIVE NEGATIVE Final   Staphylococcus aureus POSITIVE (A) NEGATIVE Final    Comment: (NOTE) The Xpert SA Assay (FDA approved for NASAL specimens in patients 27 years of age and older), is one component of a comprehensive surveillance program. It is not intended to diagnose infection nor to guide or monitor treatment.     Radiology Studies: Dg Ercp  Result Date: 07/30/2018 CLINICAL DATA:  72 year old male with presumed malignant biliary obstruction undergoing ERCP and biliary stent placement. EXAM: ERCP TECHNIQUE: Multiple spot images obtained with the fluoroscopic device and submitted for interpretation post-procedure. FLUOROSCOPY TIME:  Fluoroscopy Time:  1 minutes 54 seconds COMPARISON:  CT abdomen/pelvis obtained yesterday 07/29/2018 FINDINGS: 4 intraoperative spot radiographs demonstrate a flexible endoscope in the descending duodenum with wire cannulation of the right intrahepatic ducts. Subsequent imaging demonstrates placement of a metal biliary stent. IMPRESSION: ERCP with metal biliary stent placement. These images were submitted for radiologic interpretation only. Please see the procedural report for the amount of contrast and the fluoroscopy time utilized. Electronically Signed   By: Jacqulynn Cadet M.D.   On: 07/30/2018 14:30   Scheduled Meds: . amitriptyline  50 mg Oral QHS  . docusate sodium  100 mg Oral BID  . multivitamin with minerals  1 tablet Oral q morning - 10a  . nicotine  21 mg Transdermal Daily  . polyethylene glycol  17 g Oral BID  . tamsulosin  0.4 mg Oral QHS  . venlafaxine XR  75 mg Oral Q breakfast   Continuous Infusions:   LOS: 1 day   Kerney Elbe, DO [facility] PAGER is on [place]  If 7PM-7AM, please contact night-coverage [email or web address] Password [facility] 07/30/2018, 3:18 PM

## 2018-07-30 NOTE — Anesthesia Preprocedure Evaluation (Signed)
Anesthesia Evaluation  Patient identified by MRN, date of birth, ID band Patient awake    Reviewed: Allergy & Precautions, H&P , NPO status , Patient's Chart, lab work & pertinent test results  History of Anesthesia Complications Negative for: history of anesthetic complications  Airway Mallampati: III  TM Distance: >3 FB Neck ROM: Full    Dental  (+) Teeth Intact   Pulmonary sleep apnea and Continuous Positive Airway Pressure Ventilation , COPD,  COPD inhaler, Current Smoker,    breath sounds clear to auscultation       Cardiovascular Exercise Tolerance: Good hypertension, Pt. on medications (-) angina(-) Past MI and (-) CHF  Rhythm:Regular Rate:Normal     Neuro/Psych PSYCHIATRIC DISORDERS Anxiety Depression negative neurological ROS     GI/Hepatic Neg liver ROS, GERD  ,Bile duct obstruction    Endo/Other  negative endocrine ROS  Renal/GU negative Renal ROS     Musculoskeletal  (+) Arthritis ,   Abdominal   Peds  Hematology  (+) Blood dyscrasia, anemia ,   Anesthesia Other Findings   Reproductive/Obstetrics                             Anesthesia Physical Anesthesia Plan  ASA: III  Anesthesia Plan: General   Post-op Pain Management:    Induction: Intravenous  PONV Risk Score and Plan: 1 and Ondansetron and Dexamethasone  Airway Management Planned: Oral ETT  Additional Equipment: None  Intra-op Plan:   Post-operative Plan: Extubation in OR  Informed Consent: I have reviewed the patients History and Physical, chart, labs and discussed the procedure including the risks, benefits and alternatives for the proposed anesthesia with the patient or authorized representative who has indicated his/her understanding and acceptance.   Dental advisory given  Plan Discussed with: CRNA and Surgeon  Anesthesia Plan Comments:         Anesthesia Quick Evaluation

## 2018-07-30 NOTE — Op Note (Signed)
[facility] Patient Name: Nathaniel Lee Procedure Date : 07/30/2018 MRN: [id number] Attending MD: Carol Ada , MD Date of Birth: [birth year] CSN: [id number] Age: 72 Admit Type: Inpatient Procedure:                ERCP Indications:              Pancreatic tumor on Computed Tomogram Scan Providers:                Carol Ada, MD, Cleda Daub, RN, Nevin Bloodgood, Technician, Marrion Coy, CRNA Referring MD:              Medicines:                General Anesthesia Complications:            No immediate complications. Estimated Blood Loss:     Estimated blood loss was minimal. Procedure:                Pre-Anesthesia Assessment:                           - Prior to the procedure, a History and Physical                            was performed, and patient medications and                            allergies were reviewed. The patient's tolerance of                            previous anesthesia was also reviewed. The risks                            and benefits of the procedure and the sedation                            options and risks were discussed with the patient.                            All questions were answered, and informed consent                            was obtained. Prior Anticoagulants: The patient has                            taken no previous anticoagulant or antiplatelet                            agents. ASA Grade Assessment: III - A patient with                            severe systemic disease. After reviewing the risks  and benefits, the patient was deemed in                            satisfactory condition to undergo the procedure.                           - Sedation was administered by an anesthesia                            professional. General anesthesia was attained.                           After obtaining informed consent, the scope was                            passed under  direct vision. Throughout the                            procedure, the patient's blood pressure, pulse, and                            oxygen saturations were monitored continuously. The                            [id number]-[id number] ([id number] Olympus Duodensocope was                            introduced through the mouth, and used to inject                            contrast into and used to inject contrast into the                            bile duct. The ERCP was accomplished without                            difficulty. The patient tolerated the procedure                            well. Scope In: Scope Out: Findings:      The major papilla was normal. A short 0.035 inch Soft Jagwire was passed       into the biliary tree. A 5 mm biliary sphincterotomy was made with a       monofilament traction (standard) sphincterotome using ERBE       electrocautery. The sphincterotomy oozed blood. One 10 mm by 4 cm       covered metal stent with no external flaps and no internal flaps was       placed 4.5 cm into the common bile duct. Bile and sludge flowed through       the stent. The stent was in good position.      Looping was encountered with advancement of the duodenoscope from the       gastric lumen into the duodenal lumen. In the first and second portions       of the  duodenum there was evidence of superficial scattered ulcerations.       The ampulla was identified hiding in some duodenal folds. During the       first attempt the CBD was successfully cannulated. The guidewire was       secured in the right intrahepatic ducts. Contrast injection revealed a       dilated CBD at 12-15 mm and there was a filling defect in the mid CBD       consistent with a stone. In the distal CBD There was evidence of a 3-4       cm stricture. The intrahepatic ducts also exhibited dilation. A 5-8 mm       sphincterotomy was created, but it was not extended further as the       adjacent duodenal folds were being  cauterized with extension of the       sphincterotomy. Minor oozing was encountered. A 10 mm x 40 mm covered       metallic stent was inserted with ease and successfully deployed. A       copious amount of dark bile and sludge exited the stent. Impression:               - The major papilla appeared normal.                           - A biliary sphincterotomy was performed.                           - One covered metal stent was placed into the                            common bile duct. Recommendation:           - Return patient to hospital ward for ongoing care.                           - Resume regular diet.                           - Perform an upper endoscopic ultrasound (UEUS)                            this coming Friday. Procedure Code(s):        --- Professional ---                           [phone], Endoscopic retrograde                            cholangiopancreatography (ERCP); with placement of                            endoscopic stent into biliary or pancreatic duct,                            including pre- and post-dilation and guide wire                            passage, when  performed, including sphincterotomy,                            when performed, each stent Diagnosis Code(s):        --- Professional ---                           D49.0, Neoplasm of unspecified behavior of                            digestive system CPT copyright 2017 [employer]. All rights reserved. The codes documented in this report are preliminary and upon coder review may  be revised to meet current compliance requirements. Carol Ada, MD Carol Ada, MD 07/30/2018 1:49:18 PM This report has been signed electronically. Number of Addenda: 0

## 2018-07-30 NOTE — Progress Notes (Signed)
CRITICAL VALUE ALERT  Critical Value: total bilirubin: 19.5  Date & Time Notied:  07/30/18 @ 0756  Provider Notified: Dr. Alfredia Ferguson, O.  Orders Received/Actions taken: made aware

## 2018-07-31 ENCOUNTER — Other Ambulatory Visit: Payer: Self-pay

## 2018-07-31 ENCOUNTER — Encounter (HOSPITAL_COMMUNITY): Payer: Self-pay | Admitting: General Practice

## 2018-07-31 LAB — MAGNESIUM: MAGNESIUM: 1.9 mg/dL (ref 1.7–2.4)

## 2018-07-31 LAB — COMPREHENSIVE METABOLIC PANEL
ALBUMIN: 2.8 g/dL — AB (ref 3.5–5.0)
ALK PHOS: 364 U/L — AB (ref 38–126)
ALT: 122 U/L — ABNORMAL HIGH (ref 0–44)
AST: 102 U/L — AB (ref 15–41)
Anion gap: 9 (ref 5–15)
BILIRUBIN TOTAL: 11.5 mg/dL — AB (ref 0.3–1.2)
BUN: 15 mg/dL (ref 8–23)
CALCIUM: 8.9 mg/dL (ref 8.9–10.3)
CO2: 25 mmol/L (ref 22–32)
CREATININE: 1.07 mg/dL (ref 0.61–1.24)
Chloride: 104 mmol/L (ref 98–111)
GFR calc Af Amer: >60 mL/min (ref >60)
GLUCOSE: 150 mg/dL — AB (ref 70–99)
Potassium: 4.2 mmol/L (ref 3.5–5.1)
Sodium: 138 mmol/L (ref 135–145)
TOTAL PROTEIN: 6.1 g/dL — AB (ref 6.5–8.1)

## 2018-07-31 LAB — CBC WITH DIFFERENTIAL/PLATELET
Abs Immature Granulocytes: 0 10*3/uL (ref 0.0–0.1)
Basophils Absolute: 0 10*3/uL (ref 0.0–0.1)
Basophils Relative: 0 %
EOS ABS: 0.1 10*3/uL (ref 0.0–0.7)
EOS PCT: 1 %
HEMATOCRIT: 37.6 % — AB (ref 39.0–52.0)
HEMOGLOBIN: 12.6 g/dL — AB (ref 13.0–17.0)
IMMATURE GRANULOCYTES: 0 %
LYMPHS ABS: 2.3 10*3/uL (ref 0.7–4.0)
LYMPHS PCT: 24 %
MCH: 31.7 pg (ref 26.0–34.0)
MCHC: 33.5 g/dL (ref 30.0–36.0)
MCV: 94.5 fL (ref 78.0–100.0)
Monocytes Absolute: 0.7 10*3/uL (ref 0.1–1.0)
Monocytes Relative: 8 %
Neutro Abs: 6.4 10*3/uL (ref 1.7–7.7)
Neutrophils Relative %: 67 %
Platelets: 205 10*3/uL (ref 150–400)
RBC: 3.98 MIL/uL — AB (ref 4.22–5.81)
RDW: 18 % — ABNORMAL HIGH (ref 11.5–15.5)
WBC: 9.5 10*3/uL (ref 4.0–10.5)

## 2018-07-31 LAB — GLUCOSE, CAPILLARY: Glucose-Capillary: 169 mg/dL — ABNORMAL HIGH (ref 70–99)

## 2018-07-31 LAB — PHOSPHORUS: Phosphorus: 3.8 mg/dL (ref 2.5–4.6)

## 2018-07-31 MED ORDER — NICOTINE 21 MG/24HR TD PT24: 21.0000 mg | MEDICATED_PATCH | Freq: Every day | TRANSDERMAL | 0 refills | Status: DC

## 2018-07-31 MED ORDER — NICOTINE POLACRILEX 2 MG MT GUM: 2.0000 mg | CHEWING_GUM | OROMUCOSAL | 0 refills | Status: AC | PRN

## 2018-07-31 MED ORDER — SIMETHICONE 80 MG PO CHEW
80.0000 mg | CHEWABLE_TABLET | Freq: Four times a day (QID) | ORAL | Status: DC | PRN
  Administered 2018-07-31: 80 mg via ORAL
  Filled 2018-07-31: qty 1

## 2018-07-31 MED ORDER — SIMETHICONE 80 MG PO CHEW: 80.0000 mg | CHEWABLE_TABLET | Freq: Four times a day (QID) | ORAL | 0 refills | Status: AC | PRN

## 2018-07-31 NOTE — Progress Notes (Signed)
Responded to nurse page.  Pt had  questions about AD and was about to be D/C.  Met pt and his daughter-in-law.  They already had the packet.  I answered questions and let them know we wouldn't be able to notarize it before his d/c.  Listened to pt's concerns about his condition and prayed with him.  [place] resident, [phone]

## 2018-07-31 NOTE — Progress Notes (Signed)
OT Cancellation Note and Discharge  Patient Details Name: Nathaniel Lee MRN: [id number] DOB: 09/03/1946   Cancelled Treatment:    Reason Eval/Treat Not Completed: Other (comment). Spoke to PT who had seen pt earlier today and reports pt Mod I with all mobility and has A prn at home--did not see any OT needs. We will sign off. Golden Circle, OTR/L Acute Rehab Services Pager [phone] Office [phone]    07/31/2018, 2:24 PM

## 2018-07-31 NOTE — Evaluation (Signed)
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  His symptoms started one week ago and it was associated with diarrhea and an 8 lbs weight loss. CT revealed pancreatic mass and pt underwent ERCP with stent placement on 07/31/18.  Clinical Impression  Pt admitted with above diagnosis. Pt currently with functional limitations due to the deficits listed below (see PT Problem List). Pt independent with mobility, no physical therapy needs. Having a hard time coming to terms with diagnosis as he lost his best friend to cancer 5 years ago and has really struggled with that loss. Discussed the need for emotional support with family.  Pt will benefit from skilled PT to increase their independence and safety with mobility to allow discharge to the venue listed below.       Follow Up Recommendations No PT follow up    Equipment Recommendations  None recommended by PT    Recommendations for Other Services       Precautions / Restrictions Precautions Precautions: None Restrictions Weight Bearing Restrictions: No      Mobility  Bed Mobility Overal bed mobility: Independent                Transfers Overall transfer level: Modified independent Equipment used: None                Ambulation/Gait Ambulation/Gait assistance: Modified independent (Device/Increase time) Gait Distance (Feet): 500 Feet Assistive device: None Gait Pattern/deviations: Wide base of support Gait velocity: slightly decreased Gait velocity interpretation: >2.62 ft/sec, indicative of community ambulatory General Gait Details: pt ambulates with stiff knee gait (h/o B TKA) but no LOB. Mild SOB with increased distance, O2 sats 97%, HR up to 93 bpm  Stairs Stairs: Yes Stairs  assistance: Supervision Stair Management: One rail Right;Alternating pattern;Forwards Number of Stairs: 5 General stair comments: able to perform steps with alternating pattern , reliance on rail  Wheelchair Mobility    Modified Rankin (Stroke Patients Only)       Balance Overall balance assessment: Mild deficits observed, not formally tested                                           Pertinent Vitals/Pain Pain Assessment: No/denies pain    Home Living Family/patient expects to be discharged to:: Private residence Living Arrangements: Alone Available Help at Discharge: Family;Available PRN/intermittently Type of Home: House Home Access: Stairs to enter   [employer] of Steps: 2 Home Layout: One level Home Equipment: None      Prior Function Level of Independence: Independent               Hand Dominance        Extremity/Trunk Assessment   Upper Extremity Assessment Upper Extremity Assessment: Overall WFL for tasks assessed    Lower Extremity Assessment Lower Extremity Assessment: Overall WFL for tasks assessed    Cervical / Trunk Assessment Cervical / Trunk Assessment: Normal  Communication   Communication: HOH  Cognition Arousal/Alertness: Awake/alert Behavior During Therapy: WFL for tasks assessed/performed Overall Cognitive Status: Within Functional Limits for tasks assessed  General Comments: cognition WFL though pt reports being depressed due to this diagnosis and his best friend died 5 yrs ago of cancer      General Comments General comments (skin integrity, edema, etc.): discussed nutrition, smoking cessation, return to gym but without abdominal exercises for now    Exercises     Assessment/Plan    PT Assessment Patent does not need any further PT services  PT Problem List Decreased mobility;Cardiopulmonary status limiting activity       PT Treatment  Interventions Gait training;Stair training;Functional mobility training    PT Goals (Current goals can be found in the Care Plan section)  Acute Rehab PT Goals Patient Stated Goal: return home and to gym PT Goal Formulation: All assessment and education complete, DC therapy    Frequency     Barriers to discharge Decreased caregiver support lives alone, son and his wife will help as able    Co-evaluation               AM-PAC PT "6 Clicks" Daily Activity  Outcome Measure Difficulty turning over in bed (including adjusting bedclothes, sheets and blankets)?: None Difficulty moving from lying on back to sitting on the side of the bed? : None Difficulty sitting down on and standing up from a chair with arms (e.g., wheelchair, bedside commode, etc,.)?: None Help needed moving to and from a bed to chair (including a wheelchair)?: None Help needed walking in hospital room?: None Help needed climbing 3-5 steps with a railing? : A Little 6 Click Score: 23    End of Session   Activity Tolerance: Patient tolerated treatment well Patient left: in chair;with call bell/phone within reach;with family/visitor present Nurse Communication: Mobility status PT Visit Diagnosis: Difficulty in walking, not elsewhere classified (R26.2)    Time: 6468-0321 PT Time Calculation (min) (ACUTE ONLY): 30 min   Charges:   PT Evaluation $PT Eval Moderate Complexity: 1 Mod PT Treatments $Gait Training: 8-22 mins        [place]  Sidney 07/31/2018, 1:30 PM

## 2018-07-31 NOTE — Anesthesia Postprocedure Evaluation (Signed)
Anesthesia Post Note  Patient: Nathaniel Lee  Procedure(s) Performed: ENDOSCOPIC RETROGRADE CHOLANGIOPANCREATOGRAPHY (ERCP) (N/A ) [place]     Patient location during evaluation: Endoscopy Anesthesia Type: General Level of consciousness: awake and alert Pain management: pain level controlled Vital Signs Assessment: post-procedure vital signs reviewed and stable Respiratory status: spontaneous breathing, nonlabored ventilation, respiratory function stable and patient connected to nasal cannula oxygen Cardiovascular status: blood pressure returned to baseline and stable Postop Assessment: no apparent nausea or vomiting Anesthetic complications: no    Last Vitals:  Vitals:   07/30/18 2045 07/30/18 2210  BP: (!) 172/87   Pulse: (!) 58 62  Resp: 16 18  Temp: 36.7 C   SpO2: 96% 94%    Last Pain:  Vitals:   07/30/18 2218  TempSrc:   PainSc: 0-No pain                 Hera Celaya

## 2018-07-31 NOTE — Discharge Summary (Signed)
Physician Discharge Summary  Nathaniel Lee IRC:[id number] DOB: [birth year] DOA: 07/29/2018  PCP: Angelina Sheriff, MD  Admit date: 07/29/2018 Discharge date: 07/31/2018  Admitted From: Home Disposition: Home  Recommendations for Outpatient Follow-up:  1. Follow up with PCP in 1-2 weeks 2. Follow up with Gastroenterology Dr. Benson Norway for EUS and FNA onf Friday 08/04/18 3. Please obtain CMP/CBC,Mag, Phos in one week 4. Please follow up on the following pending results: CA 19-9  Home Health: No Equipment/Devices: None    Discharge Condition: Stable CODE STATUS: FULL CODE Diet recommendation: Heart Healthy Diet  Brief/Interim Summary: Nathaniel Valdez Coxis a 72 y.o.malewith medical history significant fortobacco abuse, hypertension, hyperlipidemia, and anxiety, now presenting to the emergency department for evaluation of painless jaundice and markedhyperbilirubinemia on outpatient blood work. Patient reports that he usually enjoys good health, exercises 6-7 times a week, and had remained in his usual state until he noted the insidious development of jaundice over the past 2 weeks. He was not experiencing any pain, nausea, or vomiting, and also denies any fevers, chills, chest pain, or shortness of breath. He saw his PCP for evaluation of this, blood work was performed, he was noted to have total bilirubin in the 17 range, and was directed to the ED for further evaluation.  ED Course:Upon arrival to the ED, patient is found to be afebrile, saturating well on room air, and with vitals otherwise normal. EKG features a sinus rhythm and chest x-ray is negative for acute cardiopulmonary disease. 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 Chemistry panel reveals alkaline phosphatase 429, AST 114, ALT 150, and total bilirubin of 17.8. CBC is unremarkable, lactic  acid is normal, INR is 1.2, and urinalysis with 2+ bilirubin. Patient was given a liter of normal saline and gastroenterology at [facility] was consulted. GI recommends a medical admission with plans for likely ERCP and biliary stenting.  **Patient underwent ERCP and Biliary Stenting on 07/30/18 by Dr. Carol Ada. Diet was advanced to Regular Diet and he did well. He improved and T Bili started trending down. Per GI patient to have EUS and FNA as an outpatient on Friday. He was deemed medically stable to D/C Home and will need to follow up with PCP and Gastroenterology in the outpatient setting.   Discharge Diagnoses:  Principal Problem:   Biliary obstruction Active Problems:   Pancreatic mass   Anxiety   Essential hypertension   OSA on CPAP   Malignant biliary obstruction (HCC)  Pancreatic mass with biliary obstructionresulting in Hyperbilirubinemia and Abnormal LFTs  -Saw PCP for eval of painless jaundice, found to have marked elevation in total bili and directed to ED where CT reveals mass at pancreatic head/uncinate with biliary obstruction -GI is consulting and much appreciated and preformed ERCP with stent placementtoday -He was initially placed on a clear liquid diet however now was placed on a regular diet after his ERCP- -CA-19-9 is being ordered and pending result -Continue supportive care and was given normal saline at rate of 90 and mils per hour which is now stopped -GI recommending EUS with FNA on Friday -T bili was 19.5 on admission to our hospital and trended down to 11.5 -LFT's essentially unchanged so will hold Statin  -Continued to monitor and trend after stent placement  Hypertension -BP at goal -Use hydralazine IVP's as needed while hospitalized and hed his home lisinopril 40 mg  p.o. Daily and can resume at D/C   Depression and Anxiety with history of panic attacks -Continue with Clonazepam 0.5 mill grams p.o. daily as needed for Anxiety and continue  with amitriptyline 50 mg p.o. nightly along with venlafaxine XR 75 mg p.o. with breakfast -Also takes Hydroxyzine 50 mg po PRN Daily and can resume at D/C  OSA -Continue CPAP qHS  Tobacco Abuse -Smoking cessation counseling given -Continue with nicotine patch 21 mg transdermally every 24 hours as well as nicotine polacrilex gum 2 mg po PRN for Smoking Cessation at D/C   Hyperlipidemia/Hypercholesterolemia -Currently holding pravastatin 20 mg p.o. Nightly given elevated LFTs -Continue to Monitor and Trend as an outpatient    BPH -Continue with Tamsulosin 0.4 mg p.o. Nightly  GERD -Not on anh Home Medications  History of Bronchitis -Added Albuterol Nebs 2.5 mg q6hprn for Wheezing and SOB  Normocytic Anemia  -Patient takes Iron Supplementation at home and will resume at D/C  -Hb/Hct stable at 12.6/37.6 -Continue to Monitor for S/Sx of Bleeding -Repeat CBC as an outpatient   Hyperglycemia -Will need outpatient HbA1c for evaluation of Diabetes  Discharge Instructions  Discharge Instructions    Call MD for:  difficulty breathing, headache or visual disturbances   Complete by:  As directed    Call MD for:  extreme fatigue   Complete by:  As directed    Call MD for:  hives   Complete by:  As directed    Call MD for:  persistant dizziness or light-headedness   Complete by:  As directed    Call MD for:  persistant nausea and vomiting   Complete by:  As directed    Call MD for:  redness, tenderness, or signs of infection (pain, swelling, redness, odor or green/yellow discharge around incision site)   Complete by:  As directed    Call MD for:  severe uncontrolled pain   Complete by:  As directed    Call MD for:  temperature >100.4   Complete by:  As directed    Diet - low sodium heart healthy   Complete by:  As directed    Discharge instructions   Complete by:  As directed    You were cared for by a hospitalist during your hospital stay. If you have any questions  about your discharge medications or the care you received while you were in the hospital after you are discharged, you can call the unit and ask to speak with the hospitalist on call if the hospitalist that took care of you is not available. Once you are discharged, your primary care physician will handle any further medical issues. Please note that NO REFILLS for any discharge medications will be authorized once you are discharged, as it is imperative that you return to your primary care physician (or establish a relationship with a primary care physician if you do not have one) for your aftercare needs so that they can reassess your need for medications and monitor your lab values.  Follow up with PCP and Gastroenterology Dr. Benson Norway on Friday for outpatient EUS and FNA. Take all medications as prescribed. If symptoms change or worsen please return to the ED for evaluation   Increase activity slowly   Complete by:  As directed      Allergies as of 07/31/2018   No Known Allergies     Medication List    STOP taking these medications   pravastatin 20 MG tablet Commonly known as:  PRAVACHOL  TAKE these medications   albuterol 108 (90 Base) MCG/ACT inhaler Commonly known as:  PROVENTIL HFA;VENTOLIN HFA Inhale 2 puffs into the lungs every 4 (four) hours as needed for wheezing or shortness of breath.   amitriptyline 50 MG tablet Commonly known as:  ELAVIL Take 50 mg by mouth at bedtime.   amoxicillin 500 MG capsule Commonly known as:  AMOXIL Take 2,000 mg by mouth See admin instructions. Take 4 capsules (2000 mg) by mouth one hour prior to dental appointment   aspirin EC 81 MG tablet Take 81 mg by mouth daily.   clonazePAM 0.5 MG tablet Commonly known as:  KLONOPIN Take 0.25 mg by mouth See admin instructions. Take 1/2 tablet (0.25 mg) by mouth every morning, may also take 3 times during the day as needed for anxiety/panic attacks   DSS 100 MG Caps Take 100 mg by mouth 2 (two) times  daily.   ferrous sulfate 325 (65 FE) MG tablet Take 1 tablet (325 mg total) by mouth 3 (three) times daily after meals.   gabapentin 100 MG capsule Commonly known as:  NEURONTIN Take 100-200 mg by mouth See admin instructions. Take one capsule (100 mg) by mouth every morning and two capsules (200 mg) at bedtime   hydrOXYzine 50 MG capsule Commonly known as:  VISTARIL Take 50 mg by mouth daily as needed for anxiety.   lisinopril 40 MG tablet Commonly known as:  PRINIVIL,ZESTRIL Take 40 mg by mouth daily.   multivitamin with minerals Tabs tablet Take 1 tablet by mouth daily. Centrum   nicotine 21 mg/24hr patch Commonly known as:  NICODERM CQ - dosed in mg/24 hours Place 1 patch (21 mg total) onto the skin daily. Start taking on:  08/01/2018   nicotine polacrilex 2 MG gum Commonly known as:  NICORETTE Take 1 each (2 mg total) by mouth as needed for smoking cessation.   oxyCODONE 5 MG immediate release tablet Commonly known as:  Oxy IR/ROXICODONE Take 1-3 tablets (5-15 mg total) by mouth every 4 (four) hours as needed for pain.   polyethylene glycol packet Commonly known as:  MIRALAX / GLYCOLAX Take 17 g by mouth 2 (two) times daily.   simethicone 80 MG chewable tablet Commonly known as:  MYLICON Chew 1 tablet (80 mg total) by mouth every 6 (six) hours as needed for flatulence.   tamsulosin 0.4 MG Caps capsule Commonly known as:  FLOMAX Take 0.4 mg by mouth at bedtime.   tiZANidine 4 MG capsule Commonly known as:  ZANAFLEX Take 1 capsule (4 mg total) by mouth 3 (three) times daily as needed for muscle spasms.   venlafaxine XR 75 MG 24 hr capsule Commonly known as:  EFFEXOR-XR Take 75 mg by mouth daily.   VISINE OP Place 1 drop into both eyes daily as needed (dry eyes/irritation).   VITAMIN D PO Take 1 tablet by mouth daily.      Follow-up Information    Angelina Sheriff, MD. Call.   Specialty:  Family Medicine Why:  Follow Up within 1 week Contact  information: [place] [address] [phone]        Carol Ada, MD. Call.   Specialty:  Gastroenterology Why:  Follow up as an outpatient  Contact information: [address] [place]  [address] [phone]          No Known Allergies  Consultations:  Gastroenterology Dr. Benson Norway  Procedures/Studies: Dg Ercp  Result Date: 07/30/2018 CLINICAL DATA:  72 year old male  with presumed malignant biliary obstruction undergoing ERCP and biliary stent placement. EXAM: ERCP TECHNIQUE: Multiple spot images obtained with the fluoroscopic device and submitted for interpretation post-procedure. FLUOROSCOPY TIME:  Fluoroscopy Time:  1 minutes 54 seconds COMPARISON:  CT abdomen/pelvis obtained yesterday 07/29/2018 FINDINGS: 4 intraoperative spot radiographs demonstrate a flexible endoscope in the descending duodenum with wire cannulation of the right intrahepatic ducts. Subsequent imaging demonstrates placement of a metal biliary stent. IMPRESSION: ERCP with metal biliary stent placement. These images were submitted for radiologic interpretation only. Please see the procedural report for the amount of contrast and the fluoroscopy time utilized. Electronically Signed   By: Jacqulynn Cadet M.D.   On: 07/30/2018 14:30    Subjective: Seen and examined at bedside and patient states he had a rough night because he had Acid Reflux. And early this Morning he was dreaming about fighting with previous colleagues about work. No CP, Lightheadedness, and Dizziness. Happy he was able to eat. No abdominal Pain. No other complaints is ready to go home after GI is clear the patient.  Patient understands that he needs to stop smoking will need to follow-up with gastroenterology Dr. Almyra Free in outpatient setting for an EUS and FNA.  Discharge Exam: Vitals:   07/30/18 2210 07/31/18 1013  BP:  139/78  Pulse: 62 (!) 56  Resp: 18 16  Temp:  98.4 F (36.9 C)  SpO2: 94% 100%    Vitals:   07/30/18 1429 07/30/18 2045 07/30/18 2210 07/31/18 1013  BP: (!) 149/76 (!) 172/87  139/78  Pulse: 65 (!) 58 62 (!) 56  Resp: 18 16 18 16   Temp: (!) 97.5 F (36.4 C) 98 F (36.7 C)  98.4 F (36.9 C)  TempSrc: Oral Oral  Oral  SpO2: 99% 96% 94% 100%  Weight:      Height:       General: Pt is an extremely Jaundice Male who is alert, awake, not in acute distress Cardiovascular: RRR, S1/S2 +, no rubs, no gallops Respiratory: Diminished bilaterally, no wheezing, no rhonchi Abdominal: Soft, NT, ND, bowel sounds +;p Umbilical Hernia noted  Extremities: no edema, no cyanosis  The results of significant diagnostics from this hospitalization (including imaging, microbiology, ancillary and laboratory) are listed below for reference.    Microbiology: Recent Results (from the past 240 hour(s))  Surgical PCR screen     Status: Abnormal   Collection Time: 07/29/18 11:29 PM  Result Value Ref Range Status   MRSA, PCR NEGATIVE NEGATIVE Final   Staphylococcus aureus POSITIVE (A) NEGATIVE Final    Comment: (NOTE) The Xpert SA Assay (FDA approved for NASAL specimens in patients 48 years of age and older), is one component of a comprehensive surveillance program. It is not intended to diagnose infection nor to guide or monitor treatment.     Labs: BNP (last 3 results) No results for input(s): BNP in the last 8760 hours. Basic Metabolic Panel: Recent Labs  Lab 07/30/18 0615 07/31/18 0916  NA 139 138  K 3.8 4.2  CL 107 104  CO2 23 25  GLUCOSE 152* 150*  BUN 7* 15  CREATININE 0.75 1.07  CALCIUM 8.9 8.9  MG  --  1.9  PHOS  --  3.8   Liver Function Tests: Recent Labs  Lab 07/30/18 0615 07/31/18 0916  AST 99* 102*  ALT 124* 122*  ALKPHOS 370* 364*  BILITOT 19.5* 11.5*  PROT 5.9* 6.1*  ALBUMIN 2.8* 2.8*   No results for input(s): LIPASE, AMYLASE in the last 168  hours. No results for input(s): AMMONIA in the last 168 hours. CBC: Recent Labs  Lab 07/30/18 0615  07/31/18 0916  WBC 6.9 9.5  NEUTROABS 4.9 6.4  HGB 12.4* 12.6*  HCT 36.5* 37.6*  MCV 93.6 94.5  PLT 189 205   Cardiac Enzymes: No results for input(s): CKTOTAL, CKMB, CKMBINDEX, TROPONINI in the last 168 hours. BNP: Invalid input(s): POCBNP CBG: Recent Labs  Lab 07/30/18 0810 07/31/18 0751  GLUCAP 156* 169*   D-Dimer No results for input(s): DDIMER in the last 72 hours. Hgb A1c No results for input(s): HGBA1C in the last 72 hours. Lipid Profile No results for input(s): CHOL, HDL, LDLCALC, TRIG, CHOLHDL, LDLDIRECT in the last 72 hours. Thyroid function studies No results for input(s): TSH, T4TOTAL, T3FREE, THYROIDAB in the last 72 hours.  Invalid input(s): FREET3 Anemia work up No results for input(s): VITAMINB12, FOLATE, FERRITIN, TIBC, IRON, RETICCTPCT in the last 72 hours. Urinalysis    Component Value Date/Time   COLORURINE YELLOW 06/27/2013 1353   APPEARANCEUR CLEAR 06/27/2013 1353   LABSPEC 1.020 06/27/2013 1353   PHURINE 6.0 06/27/2013 1353   GLUCOSEU NEGATIVE 06/27/2013 1353   HGBUR NEGATIVE 06/27/2013 1353   BILIRUBINUR NEGATIVE 06/27/2013 1353   KETONESUR NEGATIVE 06/27/2013 1353   PROTEINUR NEGATIVE 06/27/2013 1353   UROBILINOGEN 0.2 06/27/2013 1353   NITRITE NEGATIVE 06/27/2013 1353   LEUKOCYTESUR NEGATIVE 06/27/2013 1353   Sepsis Labs Invalid input(s): PROCALCITONIN,  WBC,  LACTICIDVEN Microbiology Recent Results (from the past 240 hour(s))  Surgical PCR screen     Status: Abnormal   Collection Time: 07/29/18 11:29 PM  Result Value Ref Range Status   MRSA, PCR NEGATIVE NEGATIVE Final   Staphylococcus aureus POSITIVE (A) NEGATIVE Final    Comment: (NOTE) The Xpert SA Assay (FDA approved for NASAL specimens in patients 46 years of age and older), is one component of a comprehensive surveillance program. It is not intended to diagnose infection nor to guide or monitor treatment.    Time coordinating discharge: 35 minutes  SIGNED:  Kerney Elbe, DO [facility] 07/31/2018, 2:25 PM Pager is on [place]  If 7PM-7AM, please contact night-coverage [email or web address] Password [email or web address]

## 2018-07-31 NOTE — H&P (View-Only) (Signed)
Subjective: Feeling well.  No complaints.  Objective: Vital signs in last 24 hours: Temp:  [97.5 F (36.4 C)-99 F (37.2 C)] 98.4 F (36.9 C) (09/02 1013) Pulse Rate:  [56-88] 56 (09/02 1013) Resp:  [16-26] 16 (09/02 1013) BP: (123-172)/(56-87) 139/78 (09/02 1013) SpO2:  [94 %-100 %] 100 % (09/02 1013) Last BM Date: 07/29/18  Intake/Output from previous day: 09/01 0701 - 09/02 0700 In: 1283.3 [P.O.:340; I.V.:893.3] Out: 455 [Urine:450; Blood:5] Intake/Output this shift: Total I/O In: 600 [P.O.:600] Out: 450 [Urine:450]  General appearance: alert and no distress GI: soft, non-tender; bowel sounds normal; no masses,  no organomegaly  Lab Results: Recent Labs    07/30/18 0615 07/31/18 0916  WBC 6.9 9.5  HGB 12.4* 12.6*  HCT 36.5* 37.6*  PLT 189 205   BMET Recent Labs    07/30/18 0615 07/31/18 0916  NA 139 138  K 3.8 4.2  CL 107 104  CO2 23 25  GLUCOSE 152* 150*  BUN 7* 15  CREATININE 0.75 1.07  CALCIUM 8.9 8.9   LFT Recent Labs    07/31/18 0916  PROT 6.1*  ALBUMIN 2.8*  AST 102*  ALT 122*  ALKPHOS 364*  BILITOT 11.5*   PT/INR Recent Labs    07/30/18 0615  LABPROT 15.8*  INR 1.27   Hepatitis Panel No results for input(s): HEPBSAG, HCVAB, HEPAIGM, HEPBIGM in the last 72 hours. C-Diff No results for input(s): CDIFFTOX in the last 72 hours. Fecal Lactopherrin No results for input(s): FECLLACTOFRN in the last 72 hours.  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 EXAM: ERCP TECHNIQUE: Multiple spot images obtained with the fluoroscopic device and submitted for interpretation post-procedure. FLUOROSCOPY TIME:  Fluoroscopy Time:  1 minutes 54 seconds COMPARISON:  CT abdomen/pelvis obtained yesterday 07/29/2018 FINDINGS: 4 intraoperative spot radiographs demonstrate a flexible endoscope in the descending duodenum with wire  cannulation of the right intrahepatic ducts. Subsequent imaging demonstrates placement of a metal biliary stent. IMPRESSION: ERCP with metal biliary stent placement. These images were submitted for radiologic interpretation only. Please see the procedural report for the amount of contrast and the fluoroscopy time utilized. Electronically Signed   By: Jacqulynn Cadet M.D.   On: 07/30/2018 14:30    Medications:  Scheduled: . amitriptyline  50 mg Oral QHS  . docusate sodium  100 mg Oral BID  . multivitamin with minerals  1 tablet Oral q morning - 10a  . nicotine  21 mg Transdermal Daily  . polyethylene glycol  17 g Oral BID  . tamsulosin  0.4 mg Oral QHS  . venlafaxine XR  75 mg Oral Q breakfast   Continuous:   Assessment/Plan: 1) Pancreatic mass. 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   The patient is well.  No post-ERCP complications.  His TB has declined significantly from 19.5 down to 11.    Plan: 1) EUS with FNA as an outpatient this Friday.  I will make the arrangements. 2) Okay to D/C home from the GI standpoint.  LOS: 2 days   Bill Mcvey D 07/31/2018, 11:20 AM

## 2018-07-31 NOTE — Progress Notes (Signed)
Subjective: Feeling well.  No complaints.  Objective: Vital signs in last 24 hours: Temp:  [97.5 F (36.4 C)-99 F (37.2 C)] 98.4 F (36.9 C) (09/02 1013) Pulse Rate:  [56-88] 56 (09/02 1013) Resp:  [16-26] 16 (09/02 1013) BP: (123-172)/(56-87) 139/78 (09/02 1013) SpO2:  [94 %-100 %] 100 % (09/02 1013) Last BM Date: 07/29/18  Intake/Output from previous day: 09/01 0701 - 09/02 0700 In: 1283.3 [P.O.:340; I.V.:893.3] Out: 455 [Urine:450; Blood:5] Intake/Output this shift: Total I/O In: 600 [P.O.:600] Out: 450 [Urine:450]  General appearance: alert and no distress GI: soft, non-tender; bowel sounds normal; no masses,  no organomegaly  Lab Results: Recent Labs    07/30/18 0615 07/31/18 0916  WBC 6.9 9.5  HGB 12.4* 12.6*  HCT 36.5* 37.6*  PLT 189 205   BMET Recent Labs    07/30/18 0615 07/31/18 0916  NA 139 138  K 3.8 4.2  CL 107 104  CO2 23 25  GLUCOSE 152* 150*  BUN 7* 15  CREATININE 0.75 1.07  CALCIUM 8.9 8.9   LFT Recent Labs    07/31/18 0916  PROT 6.1*  ALBUMIN 2.8*  AST 102*  ALT 122*  ALKPHOS 364*  BILITOT 11.5*   PT/INR Recent Labs    07/30/18 0615  LABPROT 15.8*  INR 1.27   Hepatitis Panel No results for input(s): HEPBSAG, HCVAB, HEPAIGM, HEPBIGM in the last 72 hours. C-Diff No results for input(s): CDIFFTOX in the last 72 hours. Fecal Lactopherrin No results for input(s): FECLLACTOFRN in the last 72 hours.  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 EXAM: ERCP TECHNIQUE: Multiple spot images obtained with the fluoroscopic device and submitted for interpretation post-procedure. FLUOROSCOPY TIME:  Fluoroscopy Time:  1 minutes 54 seconds COMPARISON:  CT abdomen/pelvis obtained yesterday 07/29/2018 FINDINGS: 4 intraoperative spot radiographs demonstrate a flexible endoscope in the descending duodenum with wire  cannulation of the right intrahepatic ducts. Subsequent imaging demonstrates placement of a metal biliary stent. IMPRESSION: ERCP with metal biliary stent placement. These images were submitted for radiologic interpretation only. Please see the procedural report for the amount of contrast and the fluoroscopy time utilized. Electronically Signed   By: Jacqulynn Cadet M.D.   On: 07/30/2018 14:30    Medications:  Scheduled: . amitriptyline  50 mg Oral QHS  . docusate sodium  100 mg Oral BID  . multivitamin with minerals  1 tablet Oral q morning - 10a  . nicotine  21 mg Transdermal Daily  . polyethylene glycol  17 g Oral BID  . tamsulosin  0.4 mg Oral QHS  . venlafaxine XR  75 mg Oral Q breakfast   Continuous:   Assessment/Plan: 1) Pancreatic mass. 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   The patient is well.  No post-ERCP complications.  His TB has declined significantly from 19.5 down to 11.    Plan: 1) EUS with FNA as an outpatient this Friday.  I will make the arrangements. 2) Okay to D/C home from the GI standpoint.  LOS: 2 days   [name] D 07/31/2018, 11:20 AM

## 2018-08-01 LAB — CANCER ANTIGEN 19-9: CAN 19-9: 402 U/mL — AB (ref 0–35)

## 2018-08-02 ENCOUNTER — Other Ambulatory Visit: Payer: Self-pay | Admitting: Gastroenterology

## 2018-08-04 ENCOUNTER — Ambulatory Visit (HOSPITAL_COMMUNITY): Payer: Medicare Other | Admitting: Anesthesiology

## 2018-08-04 ENCOUNTER — Other Ambulatory Visit: Payer: Self-pay

## 2018-08-04 ENCOUNTER — Encounter (HOSPITAL_COMMUNITY): Payer: Self-pay

## 2018-08-04 ENCOUNTER — Ambulatory Visit (HOSPITAL_COMMUNITY)
Admission: RE | Admit: 2018-08-04 | Discharge: 2018-08-04 | Disposition: A | Discharge status: Home / Self Care | Payer: Medicare Other | Source: Ambulatory Visit | Attending: Gastroenterology | Admitting: Gastroenterology

## 2018-08-04 ENCOUNTER — Encounter (HOSPITAL_COMMUNITY)
Admission: RE | Disposition: A | Discharge status: Home / Self Care | Payer: Self-pay | Source: Ambulatory Visit | Attending: Gastroenterology

## 2018-08-04 DIAGNOSIS — G4730 Sleep apnea, unspecified: Secondary | ICD-10-CM | POA: Insufficient documentation

## 2018-08-04 DIAGNOSIS — E7800 Pure hypercholesterolemia, unspecified: Secondary | ICD-10-CM | POA: Diagnosis not present

## 2018-08-04 DIAGNOSIS — K869 Disease of pancreas, unspecified: Secondary | ICD-10-CM | POA: Diagnosis present

## 2018-08-04 DIAGNOSIS — R933 Abnormal findings on diagnostic imaging of other parts of digestive tract: Secondary | ICD-10-CM | POA: Diagnosis not present

## 2018-08-04 DIAGNOSIS — C250 Malignant neoplasm of head of pancreas: Secondary | ICD-10-CM | POA: Diagnosis not present

## 2018-08-04 DIAGNOSIS — Z79899 Other long term (current) drug therapy: Secondary | ICD-10-CM | POA: Diagnosis not present

## 2018-08-04 DIAGNOSIS — F419 Anxiety disorder, unspecified: Secondary | ICD-10-CM | POA: Diagnosis not present

## 2018-08-04 DIAGNOSIS — M1990 Unspecified osteoarthritis, unspecified site: Secondary | ICD-10-CM | POA: Diagnosis not present

## 2018-08-04 DIAGNOSIS — F329 Major depressive disorder, single episode, unspecified: Secondary | ICD-10-CM | POA: Insufficient documentation

## 2018-08-04 DIAGNOSIS — Z7982 Long term (current) use of aspirin: Secondary | ICD-10-CM | POA: Insufficient documentation

## 2018-08-04 DIAGNOSIS — F17210 Nicotine dependence, cigarettes, uncomplicated: Secondary | ICD-10-CM | POA: Diagnosis not present

## 2018-08-04 DIAGNOSIS — Z96611 Presence of right artificial shoulder joint: Secondary | ICD-10-CM | POA: Insufficient documentation

## 2018-08-04 DIAGNOSIS — K219 Gastro-esophageal reflux disease without esophagitis: Secondary | ICD-10-CM | POA: Diagnosis not present

## 2018-08-04 DIAGNOSIS — Z96653 Presence of artificial knee joint, bilateral: Secondary | ICD-10-CM | POA: Diagnosis not present

## 2018-08-04 DIAGNOSIS — I10 Essential (primary) hypertension: Secondary | ICD-10-CM | POA: Insufficient documentation

## 2018-08-04 DIAGNOSIS — K831 Obstruction of bile duct: Secondary | ICD-10-CM | POA: Diagnosis not present

## 2018-08-04 DIAGNOSIS — K8689 Other specified diseases of pancreas: Secondary | ICD-10-CM | POA: Diagnosis not present

## 2018-08-04 HISTORY — PX: UPPER ESOPHAGEAL ENDOSCOPIC ULTRASOUND (EUS): SHX6562

## 2018-08-04 HISTORY — PX: FINE NEEDLE ASPIRATION: SHX5430

## 2018-08-04 SURGERY — UPPER ESOPHAGEAL ENDOSCOPIC ULTRASOUND (EUS)
Anesthesia: Monitor Anesthesia Care

## 2018-08-04 MED ORDER — LIDOCAINE 2% (20 MG/ML) 5 ML SYRINGE
INTRAMUSCULAR | Status: DC | PRN
  Administered 2018-08-04: 100 mg via INTRAVENOUS

## 2018-08-04 MED ORDER — PROPOFOL 10 MG/ML IV BOLUS
INTRAVENOUS | Status: AC
  Filled 2018-08-04: qty 20

## 2018-08-04 MED ORDER — PROPOFOL 500 MG/50ML IV EMUL
INTRAVENOUS | Status: DC | PRN
  Administered 2018-08-04: 125 ug/kg/min via INTRAVENOUS

## 2018-08-04 MED ORDER — PROPOFOL 10 MG/ML IV BOLUS
INTRAVENOUS | Status: DC | PRN
  Administered 2018-08-04: 60 mg via INTRAVENOUS
  Administered 2018-08-04: 20 mg via INTRAVENOUS
  Administered 2018-08-04: 30 mg via INTRAVENOUS
  Administered 2018-08-04: 20 mg via INTRAVENOUS
  Administered 2018-08-04: 30 mg via INTRAVENOUS
  Administered 2018-08-04: 10 mg via INTRAVENOUS
  Administered 2018-08-04: 20 mg via INTRAVENOUS

## 2018-08-04 MED ORDER — LACTATED RINGERS IV SOLN
INTRAVENOUS | Status: DC
  Administered 2018-08-04: 12:00:00 via INTRAVENOUS

## 2018-08-04 MED ORDER — SODIUM CHLORIDE 0.9 % IV SOLN: INTRAVENOUS | Status: DC

## 2018-08-04 MED ORDER — EPHEDRINE SULFATE-NACL 50-0.9 MG/10ML-% IV SOSY
PREFILLED_SYRINGE | INTRAVENOUS | Status: DC | PRN
  Administered 2018-08-04 (×3): 5 mg via INTRAVENOUS

## 2018-08-04 NOTE — Transfer of Care (Signed)
Immediate Anesthesia Transfer of Care Note  Patient: Nathaniel Lee  Procedure(s) Performed: Procedure(s): UPPER ESOPHAGEAL ENDOSCOPIC ULTRASOUND (EUS) (N/A) FINE NEEDLE ASPIRATION (FNA) LINEAR (N/A)  Patient Location: PACU  Anesthesia Type:MAC  Level of Consciousness: Patient easily awoken, sedated, comfortable, cooperative, following commands, responds to stimulation.   Airway & Oxygen Therapy: Patient spontaneously breathing, ventilating well, oxygen via simple oxygen mask.  Post-op Assessment: Report given to PACU RN, vital signs reviewed and stable, moving all extremities.   Post vital signs: Reviewed and stable.  Complications: No apparent anesthesia complications Last Vitals:  Vitals Value Taken Time  BP 80/66 08/04/2018  1:40 PM  Temp    Pulse 71 08/04/2018  1:43 PM  Resp 16 08/04/2018  1:43 PM  SpO2 100 % 08/04/2018  1:43 PM  Vitals shown include unvalidated device data.  Last Pain:  Vitals:   08/04/18 1118  TempSrc: Oral  PainSc: 0-No pain         Complications: No apparent anesthesia complications

## 2018-08-04 NOTE — Discharge Instructions (Signed)

## 2018-08-04 NOTE — Anesthesia Procedure Notes (Signed)
Procedure Name: MAC Date/Time: 08/04/2018 12:43 PM Performed by: Deliah Boston, CRNA Pre-anesthesia Checklist: Patient identified, Emergency Drugs available, Suction available, Patient being monitored and Timeout performed Patient Re-evaluated:Patient Re-evaluated prior to induction Oxygen Delivery Method: Nasal cannula Preoxygenation: Pre-oxygenation with 100% oxygen Placement Confirmation: breath sounds checked- equal and bilateral and CO2 detector

## 2018-08-04 NOTE — Interval H&P Note (Signed)
History and Physical Interval Note:  08/04/2018 11:16 AM  Nathaniel Lee  has presented today for surgery, with the diagnosis of pancreatic head mass  The various methods of treatment have been discussed with the patient and family. After consideration of risks, benefits and other options for treatment, the patient has consented to  Procedure(s): UPPER ESOPHAGEAL ENDOSCOPIC ULTRASOUND (EUS) (N/A) FINE NEEDLE ASPIRATION (FNA) LINEAR (N/A) as a surgical intervention .  The patient's history has been reviewed, patient examined, no change in status, stable for surgery.  I have reviewed the patient's chart and labs.  Questions were answered to the patient's satisfaction.     Debby Clyne D

## 2018-08-04 NOTE — Anesthesia Postprocedure Evaluation (Signed)
Anesthesia Post Note  Patient: Nathaniel Lee  Procedure(s) Performed: UPPER ESOPHAGEAL ENDOSCOPIC ULTRASOUND (EUS) (N/A ) FINE NEEDLE ASPIRATION (FNA) LINEAR (N/A )     Patient location during evaluation: PACU Anesthesia Type: MAC Level of consciousness: awake and alert Pain management: pain level controlled Vital Signs Assessment: post-procedure vital signs reviewed and stable Respiratory status: spontaneous breathing, nonlabored ventilation, respiratory function stable and patient connected to nasal cannula oxygen Cardiovascular status: stable and blood pressure returned to baseline Postop Assessment: no apparent nausea or vomiting Anesthetic complications: no    Last Vitals:  Vitals:   08/04/18 1405 08/04/18 1415  BP:  (!) 149/82  Pulse: (!) 52 (!) 52  Resp: 19 18  Temp:    SpO2: 100% 100%    Last Pain:  Vitals:   08/04/18 1415  TempSrc:   PainSc: 0-No pain                 Effie Berkshire

## 2018-08-04 NOTE — Anesthesia Preprocedure Evaluation (Signed)
Anesthesia Evaluation  Patient identified by MRN, date of birth, ID band Patient awake    Reviewed: Allergy & Precautions, NPO status , Patient's Chart, lab work & pertinent test results  Airway Mallampati: II  TM Distance: >3 FB Neck ROM: Full    Dental  (+) Teeth Intact, Dental Advisory Given   Pulmonary sleep apnea and Continuous Positive Airway Pressure Ventilation , Current Smoker,    breath sounds clear to auscultation       Cardiovascular hypertension,  Rhythm:Regular Rate:Normal     Neuro/Psych Anxiety Depression    GI/Hepatic Neg liver ROS, GERD  ,  Endo/Other  negative endocrine ROS  Renal/GU negative Renal ROS     Musculoskeletal  (+) Arthritis , Osteoarthritis,    Abdominal   Peds  Hematology negative hematology ROS (+)   Anesthesia Other Findings   Reproductive/Obstetrics                            Anesthesia Physical Anesthesia Plan  ASA: II  Anesthesia Plan: MAC   Post-op Pain Management:    Induction: Intravenous  PONV Risk Score and Plan: 0 and Propofol infusion  Airway Management Planned: Nasal Cannula  Additional Equipment: None  Intra-op Plan:   Post-operative Plan:   Informed Consent: I have reviewed the patients History and Physical, chart, labs and discussed the procedure including the risks, benefits and alternatives for the proposed anesthesia with the patient or authorized representative who has indicated his/her understanding and acceptance.     Plan Discussed with: CRNA  Anesthesia Plan Comments:         Anesthesia Quick Evaluation

## 2018-08-04 NOTE — Op Note (Signed)
[facility] Patient Name: Nathaniel Lee Procedure Date: 08/04/2018 MRN: [id number] Attending MD: Carol Ada , MD Date of Birth: 06-04-1946 CSN: [id number] Age: 72 Admit Type: Outpatient Procedure:                Upper EUS Indications:              Suspected mass in pancreas on CT scan Providers:                Carol Ada, MD, Baird Cancer, RN, Tinnie Gens,                            Technician, Heide Scales, CRNA Referring MD:              Medicines:                Propofol per Anesthesia Complications:            No immediate complications. Estimated Blood Loss:     Estimated blood loss: none. Procedure:                Pre-Anesthesia Assessment:                           - Prior to the procedure, a History and Physical                            was performed, and patient medications and                            allergies were reviewed. The patient's tolerance of                            previous anesthesia was also reviewed. The risks                            and benefits of the procedure and the sedation                            options and risks were discussed with the patient.                            All questions were answered, and informed consent                            was obtained. Prior Anticoagulants: The patient has                            taken no previous anticoagulant or antiplatelet                            agents. ASA Grade Assessment: II - A patient with                            mild systemic disease. After reviewing the risks  and benefits, the patient was deemed in                            satisfactory condition to undergo the procedure.                           - Sedation was administered by an anesthesia                            professional. Deep sedation was attained.                           After obtaining informed consent, the endoscope was                            passed under direct  vision. Throughout the                            procedure, the patient's blood pressure, pulse, and                            oxygen saturations were monitored continuously. The                            [id number] ([id number]) Olympus Linear EUS was                            introduced through the mouth, and advanced to the                            second part of duodenum. The upper EUS was                            accomplished without difficulty. The patient                            tolerated the procedure well. Scope In: Scope Out: Findings:      ENDOSONOGRAPHIC FINDING: :      An irregular mass was identified in the pancreatic head and uncinate       process of the pancreas. The mass was hypoechoic. The mass measured 37       mm by 20 mm in maximal cross-sectional diameter. The endosonographic       borders were well-defined. An intact interface was seen between the mass       and the superior mesenteric artery and celiac trunk suggesting a lack of       invasion. The remainder of the pancreas was examined. The       endosonographic appearance of parenchyma and the upstream pancreatic       duct indicated duct dilation and a maximum duct diameter of 6 mm. Fine       needle aspiration for cytology was performed. Color Doppler imaging was       utilized prior to needle puncture to confirm a lack of significant       vascular structures within the needle path. Five passes were made  with       the 25 gauge needle using a transduodenal approach. A stylet was used. A       cytotechnologist was present to evaluate the adequacy of the specimen.       The cellularity of the specimen was adequate. Final cytology results are       pending.      The large and irregularly, but well-defined hypoechoic pancreatic head       mass was readily identified. There was no overt invasion in the       surrounding vessels. Artifact from the metallic biliary stent precluded       a complete view of  the portal vein, but it did not appear that the mass       involved this area. The celiac axis and SMA were negative for any       evidence of tumor involvement or lymph nodes. Two to 3 peripancreatic       lymph nodes were identified, with the largest measuring approximately 1       cm. The left adrenal gland was normal and there was no overt evidence of       any hepatic involvement in the left lobe of the liver. Impression:               - A mass was identified in the pancreatic head and                            uncinate process of the pancreas. Fine needle                            aspiration performed. Moderate Sedation:      N/A- Per Anesthesia Care Recommendation:           - Patient has a contact number available for                            emergencies. The signs and symptoms of potential                            delayed complications were discussed with the                            patient. Return to normal activities tomorrow.                            Written discharge instructions were provided to the                            patient.                           - Resume regular diet.                           - Await cytology results. Procedure Code(s):        --- Professional ---                           [phone], Esophagogastroduodenoscopy, flexible,  transoral; with transendoscopic ultrasound-guided                            intramural or transmural fine needle                            aspiration/biopsy(s), (includes endoscopic                            ultrasound examination limited to the esophagus,                            stomach or duodenum, and adjacent structures) Diagnosis Code(s):        --- Professional ---                           K86.89, Other specified diseases of pancreas                           R93.3, Abnormal findings on diagnostic imaging of                            other parts of digestive tract CPT  copyright 2017 [employer]. All rights reserved. The codes documented in this report are preliminary and upon coder review may  be revised to meet current compliance requirements. Carol Ada, MD Carol Ada, MD 08/04/2018 1:44:11 PM This report has been signed electronically. Number of Addenda: 0

## 2018-08-09 NOTE — Progress Notes (Signed)
[facility]  [phone] Fax:([phone]) [phone]  Clinic New Consult Note   Patient Care Team: Angelina Sheriff, MD as PCP - General (Unknown Physician Specialty) 08/10/2018  REFERRAL PHYSICIAN: Dr. Carol Ada   CHIEF COMPLAINTS/PURPOSE OF CONSULTATION:  Newly Diagnosed Pancreatic Cancer  HISTORY OF PRESENTING ILLNESS:  Nathaniel Lee 72 y.o. male is here because of newly diagnosed pancreatic cancer.  He initially saw his PCP Dr. Angelina Sheriff, for painless jaundice, diarrhea, and 8 Ibs weight loss that started one week before his visit. His PCP ran blood tests and found bilirubin at 18 and therefore recommended patient be sent to ER. He was later admitted to the hospital on 07/29/2018. He underwent ERCP and Biliary Stenting on 07/30/18 by Dr. Carol Ada.  He subsequently underwent EUS and pancreatic mass biopsy by Dr. Benson Norway, which showed adenocarcinoma.  Today, he is here with his family members. He states that he also noticed that his urine is dark and his stools are loose and light in color, but have become darker after the stent placement. He has a BM 3-4 times a day. He feels a lot better after the stent placement. He reports better appetite and he states that he quit smoking 2 weeks ago. He smoked for years before and tried to quit previously.  He has history of anxiety and depression, which got much worse since he developed jaundice, and is now improved after stent placement.   He lives alone and is able to carry out daily activities independently.  He used to be very physically active, goes to gym 5-6 times a week for exercise and socialization.  He has developed some fatigue since the jaundice, but is still able to carry on daily activities at home, but has slowed down exercise, he tries to stay active and exercise daily. He had multiple orthopedic surgeries, and recovered very well.  He also has sleep apnea a uses CPAP. He sometimes complains of GERD.  He uses an inhaler for his COPD.  His mother had breast cancer in her 41s. He states that there is another family member, probably his uncle who had cancer too.   MEDICAL HISTORY:  Past Medical History:  Diagnosis Date  . Anxiety    h/o panic attacks - related to anxiety   . Arthritis    R shoulder, both knees  . Deafness in right ear   . Depression   . GERD (gastroesophageal reflux disease)    food related  . History of bronchitis   . Hypercholesteremia   . Hypertension    ekg- last one done 2010 at Dr. Milinda Cave office- [place] , sees Dr. Delena Bali- [facility] now for PCP, [place].   . OSA on CPAP     SURGICAL HISTORY: Past Surgical History:  Procedure Laterality Date  . BILIARY STENT PLACEMENT  07/30/2018   Procedure: BILIARY STENT PLACEMENT;  Surgeon: Carol Ada, MD;  Location: [facility];  Service: Endoscopy;;  . CARPAL TUNNEL RELEASE     L hand - poor results, per pt. - 2012  . ERCP N/A 07/30/2018   Procedure: ENDOSCOPIC RETROGRADE CHOLANGIOPANCREATOGRAPHY (ERCP);  Surgeon: Carol Ada, MD;  Location: [facility];  Service: Endoscopy;  Laterality: N/A;  . FINE NEEDLE ASPIRATION N/A 08/04/2018   Procedure: FINE NEEDLE ASPIRATION (FNA) LINEAR;  Surgeon: Carol Ada, MD;  Location: [facility];  Service: Endoscopy;  Laterality: N/A;  . SPHINCTEROTOMY  07/30/2018   Procedure: SPHINCTEROTOMY;  Surgeon: Carol Ada, MD;  Location: [facility];  Service: Endoscopy;;  . TOTAL KNEE ARTHROPLASTY Left 05/29/2013   Procedure: LEFT TOTAL KNEE ARTHROPLASTY;  Surgeon: Mauri Pole, MD;  Location: [facility];  Service: Orthopedics;  Laterality: Left;  . TOTAL KNEE ARTHROPLASTY Right 07/03/2013   Procedure: RIGHT TOTAL KNEE ARTHROPLASTY;  Surgeon: Mauri Pole, MD;  Location: [facility];  Service: Orthopedics;  Laterality: Right;  . TOTAL SHOULDER ARTHROPLASTY  04/13/2012   Procedure: TOTAL SHOULDER ARTHROPLASTY;  Surgeon: Marin Shutter, MD;  Location: [facility];  Service: Orthopedics;   Laterality: Right;  Right total shoulder  . UPPER ESOPHAGEAL ENDOSCOPIC ULTRASOUND (EUS) N/A 08/04/2018   Procedure: UPPER ESOPHAGEAL ENDOSCOPIC ULTRASOUND (EUS);  Surgeon: Carol Ada, MD;  Location: [facility];  Service: Endoscopy;  Laterality: N/A;    SOCIAL HISTORY: Social History   Socioeconomic History  . Marital status: Divorced    Spouse name: Not on file  . Number of children: Not on file  . Years of education: Not on file  . Highest education level: Not on file  Occupational History  . Not on file  Social Needs  . Financial resource strain: Not on file  . Food insecurity:    Worry: Not on file    Inability: Not on file  . Transportation needs:    Medical: Not on file    Non-medical: Not on file  Tobacco Use  . Smoking status: Former Smoker    Packs/day: 1.50    Years: 55.00    Pack years: 82.50    Types: Cigarettes    Last attempt to quit: 07/30/2018    Years since quitting: 0.0  . Smokeless tobacco: Never Used  . Tobacco comment: quit in 07/2018 since cancer diagnosis   Substance and Sexual Activity  . Alcohol use: No    Comment: quit alcohol - 2009- states he sometimes drank 12 beers / day before quittting   . Drug use: No  . Sexual activity: Not Currently  Lifestyle  . Physical activity:    Days per week: Not on file    Minutes per session: Not on file  . Stress: Not on file  Relationships  . Social connections:    Talks on phone: Not on file    Gets together: Not on file    Attends religious service: Not on file    Active member of club or organization: Not on file    Attends meetings of clubs or organizations: Not on file    Relationship status: Not on file  . Intimate partner violence:    Fear of current or ex partner: Not on file    Emotionally abused: Not on file    Physically abused: Not on file    Forced sexual activity: Not on file  Other Topics Concern  . Not on file  Social History Narrative  . Not on file    FAMILY  HISTORY: Family History  Problem Relation Age of Onset  . Breast cancer Mother   . Cancer Mother 52       breast cancer   . Cancer Maternal Uncle        panceratic  cancer   . Anesthesia problems Neg Hx     ALLERGIES:  has No Known Allergies.  MEDICATIONS:  Current Outpatient Medications  Medication Sig Dispense Refill  . albuterol (PROVENTIL HFA;VENTOLIN HFA) 108 (90 Base) MCG/ACT inhaler Inhale 2 puffs into the lungs every 4 (four) hours as needed for wheezing or shortness of breath.   3  .  amitriptyline (ELAVIL) 50 MG tablet Take 50 mg by mouth at bedtime.  3  . amoxicillin (AMOXIL) 500 MG capsule Take 2,000 mg by mouth See admin instructions. Take 4 capsules (2000 mg) by mouth one hour prior to dental appointment  0  . aspirin EC 81 MG tablet Take 81 mg by mouth daily.    . Cholecalciferol (VITAMIN D PO) Take 1 tablet by mouth daily.    . clonazePAM (KLONOPIN) 0.5 MG tablet Take 0.25 mg by mouth See admin instructions. Take 1/2 tablet (0.25 mg) by mouth every morning, may also take 3 times during the day as needed for anxiety/panic attacks    . docusate sodium 100 MG CAPS Take 100 mg by mouth 2 (two) times daily. 10 capsule 0  . ferrous sulfate 325 (65 FE) MG tablet Take 1 tablet (325 mg total) by mouth 3 (three) times daily after meals.  3  . gabapentin (NEURONTIN) 100 MG capsule Take 100-200 mg by mouth See admin instructions. Take one capsule (100 mg) by mouth every morning and two capsules (200 mg) at bedtime    . hydrOXYzine (VISTARIL) 50 MG capsule Take 50 mg by mouth daily as needed for anxiety.   2  . lisinopril (PRINIVIL,ZESTRIL) 40 MG tablet Take 40 mg by mouth daily.     . Multiple Vitamin (MULTIVITAMIN WITH MINERALS) TABS Take 1 tablet by mouth daily. Centrum    . nicotine (NICODERM CQ - DOSED IN MG/24 HOURS) 21 mg/24hr patch Place 1 patch (21 mg total) onto the skin daily. 28 patch 0  . nicotine polacrilex (NICORETTE) 2 MG gum Take 1 each (2 mg total) by mouth as needed  for smoking cessation. 100 tablet 0  . oxyCODONE (OXY IR/ROXICODONE) 5 MG immediate release tablet Take 1-3 tablets (5-15 mg total) by mouth every 4 (four) hours as needed for pain. 100 tablet 0  . polyethylene glycol (MIRALAX / GLYCOLAX) packet Take 17 g by mouth 2 (two) times daily. 14 each 0  . simethicone (MYLICON) 80 MG chewable tablet Chew 1 tablet (80 mg total) by mouth every 6 (six) hours as needed for flatulence. 30 tablet 0  . Tamsulosin HCl (FLOMAX) 0.4 MG CAPS Take 0.4 mg by mouth at bedtime.    . Tetrahydrozoline HCl (VISINE OP) Place 1 drop into both eyes daily as needed (dry eyes/irritation).    Marland Kitchen tiZANidine (ZANAFLEX) 4 MG capsule Take 1 capsule (4 mg total) by mouth 3 (three) times daily as needed for muscle spasms. 50 capsule 0  . venlafaxine XR (EFFEXOR-XR) 75 MG 24 hr capsule Take 75 mg by mouth daily.  2   No current facility-administered medications for this visit.     REVIEW OF SYSTEMS:   Constitutional: Denies fevers, chills or abnormal night sweats Eyes: Denies blurriness of vision, double vision or watery eyes Ears, nose, mouth, throat, and face: Denies mucositis or sore throat (+) hearing loss Respiratory: Denies cough, dyspnea or wheezes (+) sleep apnea Cardiovascular: Denies palpitation, chest discomfort or lower extremity swelling Gastrointestinal:  Denies nausea, heartburn or change in bowel habits (+) light colored stool, improving GU: (+) dark urine  Skin: Denies abnormal skin rashes (+) jaundice, improving Lymphatics: Denies new lymphadenopathy or easy bruising Neurological:Denies numbness, tingling or new weaknesses Behavioral/Psych: Mood is stable, no new changes (+) depression and anxiety  All other systems were reviewed with the patient and are negative.  PHYSICAL EXAMINATION:  ECOG PERFORMANCE STATUS: 1 - Symptomatic but completely ambulatory  Vitals:   08/10/18  1459  BP: 138/73  Pulse: 61  Resp: 18  Temp: 97.6 F (36.4 C)  SpO2: 100%    Filed Weights   08/10/18 1459  Weight: 178 lb 14.4 oz (81.1 kg)    GENERAL:alert, no distress and comfortable (+) hearing loss  SKIN: skin color, texture, turgor are normal, no rashes or significant lesions EYES: normal, conjunctiva are pink and non-injected, sclera clear OROPHARYNX:no exudate, no erythema and lips, buccal mucosa, and tongue normal. No gum bleeding or ulcers   NECK: supple, thyroid normal size, non-tender, without nodularity LYMPH:  no palpable lymphadenopathy in the cervical, axillary or inguinal LUNGS: clear to auscultation and percussion with normal breathing effort HEART: regular rate & rhythm and no murmurs and no lower extremity edema ABDOMEN:abdomen soft, non-tender and normal bowel sounds (+) mild periumbilical tenderness  Musculoskeletal:no cyanosis of digits and no clubbing  PSYCH: alert & oriented x 3 with fluent speech (+) depression and anxiety NEURO: no focal motor/sensory deficits  LABORATORY DATA:  I have reviewed the data as listed CBC Latest Ref Rng & Units 08/10/2018 07/31/2018 07/30/2018  WBC 4.0 - 10.3 K/uL 8.6 9.5 6.9  Hemoglobin 13.0 - 17.1 g/dL 11.1(L) 12.6(L) 12.4(L)  Hematocrit 38.4 - 49.9 % 33.5(L) 37.6(L) 36.5(L)  Platelets 140 - 400 K/uL 248 205 189   CMP Latest Ref Rng & Units 08/10/2018 07/31/2018 07/30/2018  Glucose 70 - 99 mg/dL 118(H) 150(H) 152(H)  BUN 8 - 23 mg/dL 15 15 7(L)  Creatinine 0.61 - 1.24 mg/dL 0.91 1.07 0.75  Sodium 135 - 145 mmol/L 145 138 139  Potassium 3.5 - 5.1 mmol/L 4.8 4.2 3.8  Chloride 98 - 111 mmol/L 109 104 107  CO2 22 - 32 mmol/L 28 25 23   Calcium 8.9 - 10.3 mg/dL 9.1 8.9 8.9  Total Protein 6.5 - 8.1 g/dL 6.3(L) 6.1(L) 5.9(L)  Total Bilirubin 0.3 - 1.2 mg/dL 3.8(HH) 11.5(H) 19.5(HH)  Alkaline Phos 38 - 126 U/L 259(H) 364(H) 370(H)  AST 15 - 41 U/L 56(H) 102(H) 99(H)  ALT 0 - 44 U/L 92(H) 122(H) 124(H)   Tumor Marker CA 19-9 Results for BARTH, TRELLA (MRN [id number]) as of 08/10/2018 15:32  Ref. Range  07/31/2018 05:04  CA 19-9 Latest Ref Range: 0 - 35 U/mL 402 (H)    Procedure  08/04/2018 Upper Endoscopy Findings: An irregular mass was identified in the pancreatic head and uncinate process of the pancreas. The mass was hypoechoic. The mass measured 37 mm by 20 mm in maximal cross-sectional diameter. The endosonographic borders were well-defined. An intact interface was seen between the mass and the superior mesenteric artery and celiac trunk suggesting a lack of invasion. The remainder of the pancreas was examined. The endosonographic appearance of parenchyma and the upstream pancreatic duct indicated duct dilation and a maximum duct diameter of 6 mm. Fine needle aspiration for cytology was performed. Color Doppler imaging was utilized prior to needle puncture to confirm a lack of significant vascular structures within the needle path. Five passes were made with the 25 gauge needle using a transduodenal approach. A stylet was used. A cytotechnologist was present to evaluate the adequacy of the specimen. The cellularity of the specimen was adequate. Final cytology results are pending. The large and irregularly, but well-defined hypoechoic pancreatic head mass was readily identified. There was no overt invasion in the surrounding vessels. Artifact from the metallic biliary stent precluded a complete view of the portal vein, but it did not appear that the mass involved this area. The celiac  axis and SMA were negative for any evidence of tumor involvement or lymph nodes. Two to 3 peripancreatic lymph nodes were identified, with the largest measuring approximately 1 cm. The left adrenal gland was normal and there was no overt evidence of any hepatic involvement in the left lobe of the liver.  Impression - A mass was identified in the pancreatic head and uncinate process of the pancreas. Fine needle aspiration performed.  ERCP with Carol Ada, MD on 07/30/2018 Findings: The major papilla was  normal. A short 0.035 inch Soft Jagwire was passed into the biliary tree. A 5 mm biliary sphincterotomy was made with a monofilament traction (standard) sphincterotome using ERBE electrocautery. The sphincterotomy oozed blood. One 10 mm by 4 cm covered metal stent with no external flaps and no internal flaps was placed 4.5 cm into the common bile duct. Bile and sludge flowed through the stent. The stent was in good position. Looping was encountered with advancement of the duodenoscope from the gastric lumen into the duodenal lumen. In the first and second portions of the duodenum there was evidence of superficial scattered ulcerations. The ampulla was identified hiding in some duodenal folds. During the first attempt the CBD was successfully cannulated. The guidewire was secured in the right intrahepatic ducts. Contrast injection revealed a dilated CBD at 12-15 mm and there was a filling defect in the mid CBD consistent with a stone. In the distal CBD There was evidence of a 3-4 cm stricture. The intrahepatic ducts also exhibited dilation. A 5-8 mm sphincterotomy was created, but it was not extended further as the adjacent duodenal folds were being cauterized with extension of the sphincterotomy. Minor oozing was encountered. A 10 mm x 40 mm covered metallic stent was inserted with ease and successfully deployed. A copious amount of dark bile and sludge exited the stent.  Impression: - The major papilla appeared normal. - A biliary sphincterotomy was performed. - One covered metal stent was placed into the common bile duct.   RADIOGRAPHIC STUDIES: I have personally reviewed the radiological images as listed and agreed with the findings in the report.    Dg Ercp  Result Date: 07/30/2018 CLINICAL DATA:  72 year old male with presumed malignant biliary obstruction undergoing ERCP and biliary stent placement. EXAM: ERCP TECHNIQUE: Multiple spot images obtained with the fluoroscopic device and  submitted for interpretation post-procedure. FLUOROSCOPY TIME:  Fluoroscopy Time:  1 minutes 54 seconds COMPARISON:  CT abdomen/pelvis obtained yesterday 07/29/2018 FINDINGS: 4 intraoperative spot radiographs demonstrate a flexible endoscope in the descending duodenum with wire cannulation of the right intrahepatic ducts. Subsequent imaging demonstrates placement of a metal biliary stent. IMPRESSION: ERCP with metal biliary stent placement. These images were submitted for radiologic interpretation only. Please see the procedural report for the amount of contrast and the fluoroscopy time utilized. Electronically Signed   By: Jacqulynn Cadet M.D.   On: 07/30/2018 14:30    ASSESSMENT & PLAN:  Nathaniel Lee is a 72 y.o. male with history of HTN, GERD, hyperlipidemia, and deafness in the right ear   1. Pancreatic Adenocarcinoma, cT2N0Mx - He presented with painless jaundice, mild diarrhea, and 8 Ibs weight loss for 2-3 weeks.  Lab showed significant hyperbilirubinemia with total bilirubin 18, underwent ERCP and a covered metal stent placement in CBD by Dr. Benson Norway -outside CT abdomen/pelvis with contrast showed a 2.1 x 2.3 cm pancreatic adenocarcinoma at the distal head/uncinate with malignant biliary obstruction, and narrowing of SMV.  EUS showed no significant vascular invasion.  No  liver or other metastasis on the CT scan. -I recommend a CT chest to complete his staging. -We reviewed that the aggressive nature of pancreatic cancer, and standard treatment of surgery, neoadjuvant or adjuvant chemotherapy.  We discussed the high risk of recurrence after surgery, and the benefits of neoadjuvant to shrink the tumor, improve the chance of completed surgical resection. -Due to the location of his tumor, he would need a Whipple surgery, which requires long OR time and long recovery after surgery. I will refer patient to see surgeon Dr. Barry Dienes to discuss further.  We briefly discussed the course of recovery, postop  nutrition including feeding tube, and main surgical complications.  From the CT report and EUS, it looks like resectable tumor. -I reviewed chemotherapy in general.  Due to his advanced age, he may not be a candidate for FOLFIRINOX, however he does have good performance status and general health, I would consider gemcitabine and Abraxane.  Potential side effects from chemotherapy were reviewed with patient and his family members in details, he voiced good understanding. -We also discussed the option of palliative chemotherapy and radiation, versus observation alone without treatment, if he declines surgery.  -I will present his case in our GI tumor board in 2 weeks, to review his scans, and discuss neoadjuvant chemotherapy versus upfront surgery. -f/u in 2 weeks  2. Hypertension  -Continue medication and follow-up with primary care physician  3. Anxiety  - Continue Effexor, prn Klonopin  -She feels better since the stent placement  4. OSA  - Continue CPAP qHS    Plan -I will refer him to Dr. Barry Dienes to discuss surgery  -CT Chest wo contrast to complete staging -f/u in 2 weeks, will present his case in GI tumor board on 9/25  -lab today     Orders Placed This Encounter  Procedures  . CBC with Differential (Cancer Center Only)    Standing Status:   Standing    Number of Occurrences:   100    Standing Expiration Date:   08/11/2023  . CMP ([facility] only)    Standing Status:   Standing    Number of Occurrences:   100    Standing Expiration Date:   08/11/2023  . CA 19.9    Standing Status:   Standing    Number of Occurrences:   100    Standing Expiration Date:   08/11/2023    All questions were answered. The patient knows to call the clinic with any problems, questions or concerns. I spent 55 minutes counseling the patient face to face. The total time spent in the appointment was 60 minutes and more than 50% was on counseling.   Dierdre Searles Dweik am acting as scribe for Dr. Truitt Merle.  I have reviewed the above documentation for accuracy and completeness, and I agree with the above.    Truitt Merle, MD 08/10/2018 5:08 PM

## 2018-08-10 ENCOUNTER — Inpatient Hospital Stay: Payer: Medicare Other

## 2018-08-10 ENCOUNTER — Telehealth: Payer: Self-pay

## 2018-08-10 ENCOUNTER — Telehealth: Payer: Self-pay | Admitting: Hematology

## 2018-08-10 ENCOUNTER — Encounter: Payer: Self-pay | Admitting: Hematology

## 2018-08-10 ENCOUNTER — Inpatient Hospital Stay: Payer: Medicare Other | Attending: Hematology | Admitting: Hematology

## 2018-08-10 DIAGNOSIS — C250 Malignant neoplasm of head of pancreas: Secondary | ICD-10-CM

## 2018-08-10 DIAGNOSIS — K219 Gastro-esophageal reflux disease without esophagitis: Secondary | ICD-10-CM | POA: Insufficient documentation

## 2018-08-10 DIAGNOSIS — I10 Essential (primary) hypertension: Secondary | ICD-10-CM

## 2018-08-10 DIAGNOSIS — J449 Chronic obstructive pulmonary disease, unspecified: Secondary | ICD-10-CM | POA: Insufficient documentation

## 2018-08-10 DIAGNOSIS — Z87891 Personal history of nicotine dependence: Secondary | ICD-10-CM | POA: Diagnosis not present

## 2018-08-10 DIAGNOSIS — Z800 Family history of malignant neoplasm of digestive organs: Secondary | ICD-10-CM | POA: Insufficient documentation

## 2018-08-10 DIAGNOSIS — D490 Neoplasm of unspecified behavior of digestive system: Secondary | ICD-10-CM

## 2018-08-10 DIAGNOSIS — G4733 Obstructive sleep apnea (adult) (pediatric): Secondary | ICD-10-CM | POA: Diagnosis not present

## 2018-08-10 DIAGNOSIS — H9191 Unspecified hearing loss, right ear: Secondary | ICD-10-CM | POA: Insufficient documentation

## 2018-08-10 DIAGNOSIS — F419 Anxiety disorder, unspecified: Secondary | ICD-10-CM | POA: Insufficient documentation

## 2018-08-10 DIAGNOSIS — Z79899 Other long term (current) drug therapy: Secondary | ICD-10-CM | POA: Diagnosis not present

## 2018-08-10 DIAGNOSIS — Z803 Family history of malignant neoplasm of breast: Secondary | ICD-10-CM | POA: Diagnosis not present

## 2018-08-10 DIAGNOSIS — C259 Malignant neoplasm of pancreas, unspecified: Secondary | ICD-10-CM | POA: Insufficient documentation

## 2018-08-10 LAB — CMP (CANCER CENTER ONLY)
ALBUMIN: 3.2 g/dL — AB (ref 3.5–5.0)
ALT: 92 U/L — ABNORMAL HIGH (ref 0–44)
AST: 56 U/L — AB (ref 15–41)
Alkaline Phosphatase: 259 U/L — ABNORMAL HIGH (ref 38–126)
Anion gap: 8 (ref 5–15)
BUN: 15 mg/dL (ref 8–23)
CHLORIDE: 109 mmol/L (ref 98–111)
CO2: 28 mmol/L (ref 22–32)
Calcium: 9.1 mg/dL (ref 8.9–10.3)
Creatinine: 0.91 mg/dL (ref 0.61–1.24)
GFR, Est AFR Am: >60 mL/min (ref >60)
GFR, Estimated: >60 mL/min (ref >60)
GLUCOSE: 118 mg/dL — AB (ref 70–99)
Potassium: 4.8 mmol/L (ref 3.5–5.1)
SODIUM: 145 mmol/L (ref 135–145)
Total Bilirubin: 3.8 mg/dL (ref 0.3–1.2)
Total Protein: 6.3 g/dL — ABNORMAL LOW (ref 6.5–8.1)

## 2018-08-10 LAB — CBC WITH DIFFERENTIAL (CANCER CENTER ONLY)
Basophils Absolute: 0 10*3/uL (ref 0.0–0.1)
Basophils Relative: 0 %
EOS PCT: 2 %
Eosinophils Absolute: 0.2 10*3/uL (ref 0.0–0.5)
HEMATOCRIT: 33.5 % — AB (ref 38.4–49.9)
Hemoglobin: 11.1 g/dL — ABNORMAL LOW (ref 13.0–17.1)
LYMPHS ABS: 3.1 10*3/uL (ref 0.9–3.3)
Lymphocytes Relative: 36 %
MCH: 32.4 pg (ref 27.2–33.4)
MCHC: 33.1 g/dL (ref 32.0–36.0)
MCV: 97.7 fL (ref 79.3–98.0)
MONO ABS: 0.7 10*3/uL (ref 0.1–0.9)
Monocytes Relative: 8 %
NEUTROS ABS: 4.6 10*3/uL (ref 1.5–6.5)
Neutrophils Relative %: 54 %
PLATELETS: 248 10*3/uL (ref 140–400)
RBC: 3.43 MIL/uL — AB (ref 4.20–5.82)
RDW: 15.1 % — AB (ref 11.0–14.6)
WBC Count: 8.6 10*3/uL (ref 4.0–10.3)

## 2018-08-10 NOTE — Telephone Encounter (Signed)
Scheduled appt per 9/12 los - gave patient AVS and calender per los,.

## 2018-08-10 NOTE — Telephone Encounter (Signed)
Left message for patient's daughter in law to make sure patient is aware of his appointment with [place] today at 2:30, requested return phone call.

## 2018-08-10 NOTE — Addendum Note (Signed)
Addended by: Truitt Merle on: 08/10/2018 05:27 PM   Modules accepted: Orders

## 2018-08-11 LAB — CANCER ANTIGEN 19-9: CA 19-9: 325 U/mL — ABNORMAL HIGH (ref 0–35)

## 2018-08-15 ENCOUNTER — Telehealth: Payer: Self-pay

## 2018-08-15 NOTE — Telephone Encounter (Signed)
Attempted to call phone numbers on file, person answering the phone states we have the wrong number.

## 2018-08-15 NOTE — Telephone Encounter (Signed)
-----   Message from Truitt Merle, MD sent at 08/12/2018 10:16 AM EDT ----- Please let pt know the lab results, LFTs have improved since his stent placement, thanks   Truitt Merle  08/12/2018

## 2018-08-16 NOTE — Progress Notes (Signed)
Spoke with patient's daughter to request that she call central scheduling to set time and date for CT chest. She will have CT AP on disc by 08/17/18 and will drop off for me to get loaded into PACS.

## 2018-08-21 ENCOUNTER — Encounter (HOSPITAL_COMMUNITY): Payer: Self-pay

## 2018-08-21 ENCOUNTER — Ambulatory Visit (HOSPITAL_COMMUNITY)
Admission: RE | Admit: 2018-08-21 | Discharge: 2018-08-21 | Disposition: A | Discharge status: Home / Self Care | Payer: Medicare Other | Source: Ambulatory Visit | Attending: Hematology | Admitting: Hematology

## 2018-08-21 DIAGNOSIS — C259 Malignant neoplasm of pancreas, unspecified: Secondary | ICD-10-CM | POA: Diagnosis not present

## 2018-08-21 DIAGNOSIS — D490 Neoplasm of unspecified behavior of digestive system: Secondary | ICD-10-CM

## 2018-08-21 DIAGNOSIS — I2510 Atherosclerotic heart disease of native coronary artery without angina pectoris: Secondary | ICD-10-CM | POA: Diagnosis not present

## 2018-08-21 DIAGNOSIS — I700 Atherosclerosis of aorta: Secondary | ICD-10-CM | POA: Insufficient documentation

## 2018-08-22 DIAGNOSIS — Z23 Encounter for immunization: Secondary | ICD-10-CM | POA: Diagnosis not present

## 2018-08-22 NOTE — Progress Notes (Signed)
Scans taken to Radiology and given to Margit Hanks to load in PACS.

## 2018-08-22 NOTE — Progress Notes (Signed)
Nathaniel Lee  Telephone:([phone]) [phone] [phone]) [phone]  Clinic Follow-up Note   Patient Care Team: Angelina Sheriff, MD as PCP - General (Unknown Physician Specialty) Carol Ada, MD as Consulting Physician (Gastroenterology) Truitt Merle, MD as Consulting Physician (Hematology) 08/24/2018   CHIEF COMPLAINTS:  F/u on Pancreatic Cancer  Oncology History   Cancer Staging Pancreatic cancer [facility]) Staging form: Exocrine Pancreas, AJCC 8th Edition - Clinical stage from 08/04/2018: Stage IB (cT2, cN0, cM0) - Signed by Truitt Merle, MD on 08/10/2018       Pancreatic cancer ([place])   07/30/2018 Procedure    ERCP with Carol Ada, MD on 07/30/2018 Findings: The major papilla was normal. A short 0.035 inch Soft Jagwire was passed into the biliary tree. A 5 mm biliary sphincterotomy was made with a monofilament traction (standard) sphincterotome using ERBE electrocautery. The sphincterotomy oozed blood. One 10 mm by 4 cm covered metal stent with no external flaps and no internal flaps was placed 4.5 cm into the common bile duct. Bile and sludge flowed through the stent. The stent was in good position. Looping was encountered with advancement of the duodenoscope from the gastric lumen into the duodenal lumen. In the first and second portions of the duodenum there was evidence of superficial scattered ulcerations. The ampulla was identified hiding in some duodenal folds. During the first attempt the CBD was successfully cannulated. The guidewire was secured in the right intrahepatic ducts. Contrast injection revealed a dilated CBD at 12-15 mm and there was a filling defect in the mid CBD consistent with a stone. In the distal CBD There was evidence of a 3-4 cm stricture. The intrahepatic ducts also exhibited dilation. A 5-8 mm sphincterotomy was created, but it was not extended further as the adjacent duodenal folds were being cauterized with extension of the sphincterotomy. Minor  oozing was encountered. A 10 mm x 40 mm covered metallic stent was inserted with ease and successfully deployed. A copious amount of dark bile and sludge exited the stent.  Impression: - The major papilla appeared normal. - A biliary sphincterotomy was performed. - One covered metal stent was placed into the common bile duct.    07/31/2018 Tumor Marker    Tumor Marker CA 19-9 Results for Nathaniel Lee, Nathaniel Lee (MRN [id number]) as of 08/10/2018 15:32  Ref. Range 07/31/2018 05:04  CA 19-9 Latest Ref Range: 0 - 35 U/mL 402 (H)       08/04/2018 Cancer Staging    Staging form: Exocrine Pancreas, AJCC 8th Edition - Clinical stage from 08/04/2018: Stage IB (cT2, cN0, cM0) - Signed by Truitt Merle, MD on 08/10/2018    08/04/2018 Procedure    08/04/2018 Upper Endoscopy Findings: An irregular mass was identified in the pancreatic head and uncinate process of the pancreas. The mass was hypoechoic. The mass measured 37 mm by 20 mm in maximal cross-sectional diameter. The endosonographic borders were well-defined. An intact interface was seen between the mass and the superior mesenteric artery and celiac trunk suggesting a lack of invasion. The remainder of the pancreas was examined. The endosonographic appearance of parenchyma and the upstream pancreatic duct indicated duct dilation and a maximum duct diameter of 6 mm. Fine needle aspiration for cytology was performed. Color Doppler imaging was utilized prior to needle puncture to confirm a lack of significant vascular structures within the needle path. Five passes were made with the 25 gauge needle using a transduodenal approach. A stylet was used. A cytotechnologist was present to evaluate the adequacy  of the specimen. The cellularity of the specimen was adequate. Final cytology results are pending. The large and irregularly, but well-defined hypoechoic pancreatic head mass was readily identified. There was no overt invasion in the surrounding vessels. Artifact  from the metallic biliary stent precluded a complete view of the portal vein, but it did not appear that the mass involved this area. The celiac axis and SMA were negative for any evidence of tumor involvement or lymph nodes. Two to 3 peripancreatic lymph nodes were identified, with the largest measuring approximately 1 cm. The left adrenal gland was normal and there was no overt evidence of any hepatic involvement in the left lobe of the liver.  Impression - A mass was identified in the pancreatic head and uncinate process of the pancreas. Fine needle aspiration performed.    08/10/2018 Initial Diagnosis    Pancreatic cancer ([place])    08/21/2018 Imaging     08/21/2018 CT Chest IMPRESSION: 1. No evidence of metastatic disease in the chest. 2. Aortic atherosclerosis (ICD10-170.0). Three-vessel coronary artery calcification.      HISTORY OF PRESENTING ILLNESS:  Nathaniel Lee 72 y.o. male is here because of newly diagnosed pancreatic cancer.  He initially saw his PCP Dr. Angelina Sheriff, for painless jaundice, diarrhea, and 8 Ibs weight loss that started one week before his visit. His PCP ran blood tests and found bilirubin at 18 and therefore recommended patient be sent to ER. He was later admitted to the hospital on 07/29/2018. He underwent ERCP and Biliary Stenting on 07/30/18 by Dr. Carol Ada.  He subsequently underwent EUS and pancreatic mass biopsy by Dr. Benson Norway, which showed adenocarcinoma.  Today, he is here with his family members. He states that he also noticed that his urine is dark and his stools are loose and light in color, but have become darker after the stent placement. He has a BM 3-4 times a day. He feels a lot better after the stent placement. He reports better appetite and he states that he quit smoking 2 weeks ago. He smoked for years before and tried to quit previously.  He has history of anxiety and depression, which got much worse since he developed jaundice, and is  now improved after stent placement.   He lives alone and is able to carry out daily activities independently.  He used to be very physically active, goes to gym 5-6 times a week for exercise and socialization.  He has developed some fatigue since the jaundice, but is still able to carry on daily activities at home, but has slowed down exercise, he tries to stay active and exercise daily. He had multiple orthopedic surgeries, and recovered very well.  He also has sleep apnea a uses CPAP. He sometimes complains of GERD. He uses an inhaler for his COPD.  His mother had breast cancer in her 12s. He states that there is another family member, probably his uncle who had cancer too.    CURRENT THERAPY Pending  INTERVAL HISTORY Nathaniel Lee is a 72 y.o. male who is here for follow-up. He had a chest CT scan to complete staging on 08/21/2018 which was negative for metastasis. He was supposed to see Dr. Barry Dienes, but didn't.  Today, he is here with his family member. He is trying to stay active and enjoy his daily activities. He attends the gym for 2 hours almost everyday. He experiences occasional abdominal pain, that he relates to his BM, as the pain is relieved by  defecating. His jaundice is improving.   He complains of left shoulder numbness and right shoulder discomfort when exercising due to previous surgery. He reports having numbness in his fingers due to carpal tunnel.  The 10 system review of systems otherwise negative.  MEDICAL HISTORY:  Past Medical History:  Diagnosis Date  . Anxiety    h/o panic attacks - related to anxiety   . Arthritis    R shoulder, both knees  . Deafness in right ear   . Depression   . GERD (gastroesophageal reflux disease)    food related  . History of bronchitis   . Hypercholesteremia   . Hypertension    ekg- last one done 2010 at Dr. Milinda Cave office- [place] , sees Dr. Delena Bali- [facility] now for PCP, [place].   . OSA on CPAP     SURGICAL  HISTORY: Past Surgical History:  Procedure Laterality Date  . BILIARY STENT PLACEMENT  07/30/2018   Procedure: BILIARY STENT PLACEMENT;  Surgeon: Carol Ada, MD;  Location: [place];  Service: Endoscopy;;  . CARPAL TUNNEL RELEASE     L hand - poor results, per pt. - 2012  . ERCP N/A 07/30/2018   Procedure: ENDOSCOPIC RETROGRADE CHOLANGIOPANCREATOGRAPHY (ERCP);  Surgeon: Carol Ada, MD;  Location: [facility];  Service: Endoscopy;  Laterality: N/A;  . FINE NEEDLE ASPIRATION N/A 08/04/2018   Procedure: FINE NEEDLE ASPIRATION (FNA) LINEAR;  Surgeon: Carol Ada, MD;  Location: [facility] ENDOSCOPY;  Service: Endoscopy;  Laterality: N/A;  . SPHINCTEROTOMY  07/30/2018   Procedure: SPHINCTEROTOMY;  Surgeon: Carol Ada, MD;  [facility] ENDOSCOPY;  Service: Endoscopy;;  . TOTAL KNEE ARTHROPLASTY Left 05/29/2013   Procedure: LEFT TOTAL KNEE ARTHROPLASTY;  Surgeon: Mauri Pole, MD;  Location: [facility];  Service: Orthopedics;  Laterality: Left;  . TOTAL KNEE ARTHROPLASTY Right 07/03/2013   Procedure: RIGHT TOTAL KNEE ARTHROPLASTY;  Surgeon: Mauri Pole, MD;  Location: [facility];  Service: Orthopedics;  Laterality: Right;  . TOTAL SHOULDER ARTHROPLASTY  04/13/2012   Procedure: TOTAL SHOULDER ARTHROPLASTY;  Surgeon: Marin Shutter, MD;  Location: [place];  Service: Orthopedics;  Laterality: Right;  Right total shoulder  . UPPER ESOPHAGEAL ENDOSCOPIC ULTRASOUND (EUS) N/A 08/04/2018   Procedure: UPPER ESOPHAGEAL ENDOSCOPIC ULTRASOUND (EUS);  Surgeon: Carol Ada, MD;  Location: [facility];  Service: Endoscopy;  Laterality: N/A;    SOCIAL HISTORY: Social History   Socioeconomic History  . Marital status: Divorced    Spouse name: Not on file  . Number of children: Not on file  . Years of education: Not on file  . Highest education level: Not on file  Occupational History  . Not on file  Social Needs  . Financial resource strain: Not on file  . Food insecurity:    Worry: Not on file    Inability:  Not on file  . Transportation needs:    Medical: Not on file    Non-medical: Not on file  Tobacco Use  . Smoking status: Former Smoker    Packs/day: 1.50    Years: 55.00    Pack years: 82.50    Types: Cigarettes    Last attempt to quit: 07/30/2018    Years since quitting: 0.0  . Smokeless tobacco: Never Used  . Tobacco comment: quit in 07/2018 since cancer diagnosis   Substance and Sexual Activity  . Alcohol use: No    Comment: quit alcohol - 2009- states he sometimes drank 12 beers / day before quittting   . Drug  use: No  . Sexual activity: Not Currently  Lifestyle  . Physical activity:    Days per week: Not on file    Minutes per session: Not on file  . Stress: Not on file  Relationships  . Social connections:    Talks on phone: Not on file    Gets together: Not on file    Attends religious service: Not on file    Active member of club or organization: Not on file    Attends meetings of clubs or organizations: Not on file    Relationship status: Not on file  . Intimate partner violence:    Fear of current or ex partner: Not on file    Emotionally abused: Not on file    Physically abused: Not on file    Forced sexual activity: Not on file  Other Topics Concern  . Not on file  Social History Narrative  . Not on file    FAMILY HISTORY: Family History  Problem Relation Age of Onset  . Breast cancer Mother   . Cancer Mother 64       breast cancer   . Cancer Maternal Uncle        panceratic  cancer   . Anesthesia problems Neg Hx     ALLERGIES:  has No Known Allergies.  MEDICATIONS:  Current Outpatient Medications  Medication Sig Dispense Refill  . albuterol (PROVENTIL HFA;VENTOLIN HFA) 108 (90 Base) MCG/ACT inhaler Inhale 2 puffs into the lungs every 4 (four) hours as needed for wheezing or shortness of breath.   3  . amitriptyline (ELAVIL) 50 MG tablet Take 50 mg by mouth at bedtime.  3  . amoxicillin (AMOXIL) 500 MG capsule Take 2,000 mg by mouth See admin  instructions. Take 4 capsules (2000 mg) by mouth one hour prior to dental appointment  0  . aspirin EC 81 MG tablet Take 81 mg by mouth daily.    . Cholecalciferol (VITAMIN D PO) Take 1 tablet by mouth daily.    . clonazePAM (KLONOPIN) 0.5 MG tablet Take 0.5 tablets (0.25 mg total) by mouth See admin instructions. Take 1/2 tablet (0.25 mg) by mouth every morning, may also take 3 times during the day as needed for anxiety/panic attacks 30 tablet 1  . docusate sodium 100 MG CAPS Take 100 mg by mouth 2 (two) times daily. 10 capsule 0  . ferrous sulfate 325 (65 FE) MG tablet Take 1 tablet (325 mg total) by mouth 3 (three) times daily after meals.  3  . gabapentin (NEURONTIN) 100 MG capsule Take 1-2 capsules (100-200 mg total) by mouth See admin instructions. Take one capsule (100 mg) by mouth every morning and two capsules (200 mg) at bedtime 90 capsule 3  . hydrOXYzine (VISTARIL) 50 MG capsule Take 1 capsule (50 mg total) by mouth daily as needed for anxiety. 30 capsule 1  . lisinopril (PRINIVIL,ZESTRIL) 40 MG tablet Take 40 mg by mouth daily.     . Multiple Vitamin (MULTIVITAMIN WITH MINERALS) TABS Take 1 tablet by mouth daily. Centrum    . nicotine (NICODERM CQ - DOSED IN MG/24 HOURS) 21 mg/24hr patch Place 1 patch (21 mg total) onto the skin daily. 28 patch 0  . nicotine polacrilex (NICORETTE) 2 MG gum Take 1 each (2 mg total) by mouth as needed for smoking cessation. 100 tablet 0  . oxyCODONE (OXY IR/ROXICODONE) 5 MG immediate release tablet Take 1-3 tablets (5-15 mg total) by mouth every 4 (four) hours as needed  for pain. 100 tablet 0  . polyethylene glycol (MIRALAX / GLYCOLAX) packet Take 17 g by mouth 2 (two) times daily. 14 each 0  . simethicone (MYLICON) 80 MG chewable tablet Chew 1 tablet (80 mg total) by mouth every 6 (six) hours as needed for flatulence. 30 tablet 0  . Tamsulosin HCl (FLOMAX) 0.4 MG CAPS Take 0.4 mg by mouth at bedtime.    . Tetrahydrozoline HCl (VISINE OP) Place 1 drop into  both eyes daily as needed (dry eyes/irritation).    Marland Kitchen tiZANidine (ZANAFLEX) 4 MG capsule Take 1 capsule (4 mg total) by mouth 3 (three) times daily as needed for muscle spasms. 50 capsule 0  . venlafaxine XR (EFFEXOR-XR) 75 MG 24 hr capsule Take 75 mg by mouth daily.  2   No current facility-administered medications for this visit.     REVIEW OF SYSTEMS:   Constitutional: Denies fevers, chills or abnormal night sweats Eyes: Denies blurriness of vision, double vision or watery eyes Ears, nose, mouth, throat, and face: Denies mucositis or sore throat (+) hearing loss Respiratory: Denies cough, dyspnea or wheezes (+) sleep apnea Cardiovascular: Denies palpitation, chest discomfort or lower extremity swelling Gastrointestinal:  Denies nausea, heartburn or change in bowel habits (+) occasional abdominal cramps  GU: No new dysuria, frequency, urgency, hesitancy, incontinence or retention.  Skin: Denies abnormal skin rashes (+) jaundice, improving Lymphatics: Denies new lymphadenopathy or easy bruising Neurological:Denies numbness, tingling or new weaknesses MSK:(+) right shoulder discomfort due to previous surgery (+) left shoulder numbness (+) bilateral finger numbness, history of CTS Behavioral/Psych: Mood is stable, no new changes (+) anxiety  All other systems were reviewed with the patient and are negative.  PHYSICAL EXAMINATION:  ECOG PERFORMANCE STATUS: 1 - Symptomatic but completely ambulatory  Vitals:   08/24/18 1035  BP: (!) 127/57  Pulse: (!) 51  Resp: 17  Temp: 97.8 F (36.6 C)  SpO2: 100%   Filed Weights   08/24/18 1035  Weight: 177 lb 1.6 oz (80.3 kg)    GENERAL:alert, no distress and comfortable (+) hearing loss  SKIN: skin color, texture, turgor are normal, no rashes or significant lesions EYES: normal, conjunctiva are pink and non-injected, sclera clear OROPHARYNX:no exudate, no erythema and lips, buccal mucosa, and tongue normal. No gum bleeding or ulcers     NECK: supple, thyroid normal size, non-tender, without nodularity LYMPH:  no palpable lymphadenopathy in the cervical, axillary or inguinal LUNGS: clear to auscultation and percussion with normal breathing effort HEART: regular rate & rhythm and no murmurs and no lower extremity edema ABDOMEN:abdomen soft, non-tender and normal bowel sounds  Musculoskeletal:no cyanosis of digits and no clubbing  PSYCH: alert & oriented x 3 with fluent speech (+) anxiety NEURO: no focal motor/sensory deficits  LABORATORY DATA:  I have reviewed the data as listed CBC Latest Ref Rng & Units 08/24/2018 08/10/2018 07/31/2018  WBC 4.0 - 10.3 K/uL 6.3 8.6 9.5  Hemoglobin 13.0 - 17.1 g/dL 11.4(L) 11.1(L) 12.6(L)  Hematocrit 38.4 - 49.9 % 33.2(L) 33.5(L) 37.6(L)  Platelets 140 - 400 K/uL 172 248 205   CMP Latest Ref Rng & Units 08/24/2018 08/10/2018 07/31/2018  Glucose 70 - 99 mg/dL 117(H) 118(H) 150(H)  BUN 8 - 23 mg/dL 18 15 15   Creatinine 0.61 - 1.24 mg/dL 0.84 0.91 1.07  Sodium 135 - 145 mmol/L 142 145 138  Potassium 3.5 - 5.1 mmol/L 4.5 4.8 4.2  Chloride 98 - 111 mmol/L 110 109 104  CO2 22 - 32 mmol/L 25 28  25  Calcium 8.9 - 10.3 mg/dL 9.1 9.1 8.9  Total Protein 6.5 - 8.1 g/dL 6.7 6.3(L) 6.1(L)  Total Bilirubin 0.3 - 1.2 mg/dL 1.9(H) 3.8(HH) 11.5(H)  Alkaline Phos 38 - 126 U/L 144(H) 259(H) 364(H)  AST 15 - 41 U/L 21 56(H) 102(H)  ALT 0 - 44 U/L 32 92(H) 122(H)   Tumor Marker CA 19-9 Results for Nathaniel Lee, Nathaniel Lee (MRN [id number]) as of 08/10/2018 15:32  Ref. Range 07/31/2018 05:04  CA 19-9 Latest Ref Range: 0 - 35 U/mL 402 (H)    Procedure  08/04/2018 Upper Endoscopy Findings: An irregular mass was identified in the pancreatic head and uncinate process of the pancreas. The mass was hypoechoic. The mass measured 37 mm by 20 mm in maximal cross-sectional diameter. The endosonographic borders were well-defined. An intact interface was seen between the mass and the superior mesenteric artery and celiac trunk  suggesting a lack of invasion. The remainder of the pancreas was examined. The endosonographic appearance of parenchyma and the upstream pancreatic duct indicated duct dilation and a maximum duct diameter of 6 mm. Fine needle aspiration for cytology was performed. Color Doppler imaging was utilized prior to needle puncture to confirm a lack of significant vascular structures within the needle path. Five passes were made with the 25 gauge needle using a transduodenal approach. A stylet was used. A cytotechnologist was present to evaluate the adequacy of the specimen. The cellularity of the specimen was adequate. Final cytology results are pending. The large and irregularly, but well-defined hypoechoic pancreatic head mass was readily identified. There was no overt invasion in the surrounding vessels. Artifact from the metallic biliary stent precluded a complete view of the portal vein, but it did not appear that the mass involved this area. The celiac axis and SMA were negative for any evidence of tumor involvement or lymph nodes. Two to 3 peripancreatic lymph nodes were identified, with the largest measuring approximately 1 cm. The left adrenal gland was normal and there was no overt evidence of any hepatic involvement in the left lobe of the liver.  Impression - A mass was identified in the pancreatic head and uncinate process of the pancreas. Fine needle aspiration performed.  ERCP with Carol Ada, MD on 07/30/2018 Findings: The major papilla was normal. A short 0.035 inch Soft Jagwire was passed into the biliary tree. A 5 mm biliary sphincterotomy was made with a monofilament traction (standard) sphincterotome using ERBE electrocautery. The sphincterotomy oozed blood. One 10 mm by 4 cm covered metal stent with no external flaps and no internal flaps was placed 4.5 cm into the common bile duct. Bile and sludge flowed through the stent. The stent was in good position. Looping was  encountered with advancement of the duodenoscope from the gastric lumen into the duodenal lumen. In the first and second portions of the duodenum there was evidence of superficial scattered ulcerations. The ampulla was identified hiding in some duodenal folds. During the first attempt the CBD was successfully cannulated. The guidewire was secured in the right intrahepatic ducts. Contrast injection revealed a dilated CBD at 12-15 mm and there was a filling defect in the mid CBD consistent with a stone. In the distal CBD There was evidence of a 3-4 cm stricture. The intrahepatic ducts also exhibited dilation. A 5-8 mm sphincterotomy was created, but it was not extended further as the adjacent duodenal folds were being cauterized with extension of the sphincterotomy. Minor oozing was encountered. A 10 mm x 40 mm covered metallic  stent was inserted with ease and successfully deployed. A copious amount of dark bile and sludge exited the stent.  Impression: - The major papilla appeared normal. - A biliary sphincterotomy was performed. - One covered metal stent was placed into the common bile duct.   RADIOGRAPHIC STUDIES: I have personally reviewed the radiological images as listed and agreed with the findings in the report.  08/21/2018 CT Chest IMPRESSION: 1. No evidence of metastatic disease in the chest. 2. Aortic atherosclerosis (ICD10-170.0). Three-vessel coronary artery calcification.  Ct Chest Wo Contrast  Result Date: 08/21/2018 CLINICAL DATA:  New diagnosis of pancreatic cancer, staging. EXAM: CT CHEST WITHOUT CONTRAST TECHNIQUE: Multidetector CT imaging of the chest was performed following the standard protocol without IV contrast. COMPARISON:  CT abdomen pelvis 07/29/2018. FINDINGS: Cardiovascular: Atherosclerotic calcification of the arterial vasculature, including three-vessel involvement of the coronary arteries. Heart size normal. No pericardial effusion. Mediastinum/Nodes:  Mediastinal lymph nodes are not enlarged by CT size criteria. Hilar regions are difficult to definitively evaluate without IV contrast but appear grossly unremarkable. No axillary adenopathy. Esophagus is grossly unremarkable. Lungs/Pleura: Mild dependent atelectasis/scarring. Lungs are otherwise clear. No pleural fluid. Upper Abdomen: Pneumobilia, related to an indwelling lower common bile duct stent. Visualized portions of the liver, gallbladder, adrenal glands, kidneys and spleen are otherwise unremarkable. Pancreas is incompletely visualized. Peripancreatic lymph node measures 1.4 cm. Visualized portions of the stomach and bowel are grossly unremarkable. Musculoskeletal: Favor irregular lucent cartilage involving the anterolateral aspects of the tenth ribs bilaterally rather than true lytic lesions. Degenerative changes in the spine. Right shoulder arthroplasty. Old rib fractures. IMPRESSION: 1. No evidence of metastatic disease in the chest. 2. Aortic atherosclerosis (ICD10-170.0). Three-vessel coronary artery calcification. Electronically Signed   By: Lorin Picket M.D.   On: 08/21/2018 14:56   Dg Ercp  Result Date: 07/30/2018 CLINICAL DATA:  72 year old male with presumed malignant biliary obstruction undergoing ERCP and biliary stent placement. EXAM: ERCP TECHNIQUE: Multiple spot images obtained with the fluoroscopic device and submitted for interpretation post-procedure. FLUOROSCOPY TIME:  Fluoroscopy Time:  1 minutes 54 seconds COMPARISON:  CT abdomen/pelvis obtained yesterday 07/29/2018 FINDINGS: 4 intraoperative spot radiographs demonstrate a flexible endoscope in the descending duodenum with wire cannulation of the right intrahepatic ducts. Subsequent imaging demonstrates placement of a metal biliary stent. IMPRESSION: ERCP with metal biliary stent placement. These images were submitted for radiologic interpretation only. Please see the procedural report for the amount of contrast and the  fluoroscopy time utilized. Electronically Signed   By: Jacqulynn Cadet M.D.   On: 07/30/2018 14:30    ASSESSMENT & PLAN:  Nathaniel Lee is a 72 y.o. male with history of HTN, GERD, hyperlipidemia, and deafness in the right ear   1. Pancreatic Adenocarcinoma, cT2N0M0 - He presented with painless jaundice, mild diarrhea, and 8 Ibs weight loss for 2-3 weeks.  Lab showed significant hyperbilirubinemia with total bilirubin 18, underwent ERCP and a covered metal stent placement in CBD by Dr. Benson Norway -outside CT abdomen/pelvis with contrast showed a 2.1 x 2.3 cm pancreatic adenocarcinoma at the distal head/uncinate with malignant biliary obstruction, and narrowing of SMV.  EUS showed no significant vascular invasion.  No liver or other metastasis on the CT scan. -CT chest was negative for metastasis.  Discussed with patient today -We reviewed that the aggressive nature of pancreatic cancer, and standard treatment of surgery, neoadjuvant or adjuvant chemotherapy.  We discussed the high risk of recurrence after surgery, and the benefits of neoadjuvant to shrink the tumor, improve  the chance of completed surgical resection. -Due to the location of his tumor, he would need a Whipple surgery, which requires long OR time and long recovery after surgery. I previously refered patient to see surgeon Dr. Barry Dienes to discuss further.  He does not have appointment with Dr. Barry Dienes yet, urgent referral was made again today.  Patient is willing to consider surgery and chemotherapy if we recommend.  Although he is elderly, he has good performance status, would be a candidate for chemotherapy. -I will present his case in our GI tumor board next week to review his scans, and discuss neoadjuvant chemotherapy versus upfront surgery. -If surgery is offered, I recommend him to consider neoadjuvant chemo with gemcitabine and Abraxane. I think FOLFIRINOX may be difficult for him due to his advanced age and medical comorbidities.  I  would prefer neoadjuvant for 4 months, which he would likely tolerate better. -We discussed port insertion. He agrees.  -He will attend chemo class soon.  -plan to start neoadjuvant chemo in 2 weeks   2. Hypertension  -Continue medication and follow-up with primary care physician  3. Anxiety  - Continue Effexor, prn Klonopin  -He feels better since the stent placement -I reviewed his medications today, I strongly encouraged him to continue seeing his psychiatrist every 3 to 4 months.  4. OSA  - Continue CPAP qHS    Plan -Urgent referral was made to Dr. Barry Dienes today, he will be seen early next week. -Tumor board discussion next week -lab today  -I will schedule chemo class, plan to start neoadjuvant chemotherapy in 2 weeks, will schedule after tumor board discussion next week. -I refilled clonazepam, gabapentin, and hydroxyzine today     No orders of the defined types were placed in this encounter.   All questions were answered. The patient knows to call the clinic with any problems, questions or concerns. I spent 25 minutes counseling the patient face to face. The total time spent in the appointment was 30 minutes and more than 50% was on counseling.   Dierdre Searles Dweik am acting as scribe for Dr. Truitt Merle.  I have reviewed the above documentation for accuracy and completeness, and I agree with the above.    Truitt Merle, MD 08/24/2018

## 2018-08-24 ENCOUNTER — Telehealth: Payer: Self-pay | Admitting: Hematology

## 2018-08-24 ENCOUNTER — Inpatient Hospital Stay (HOSPITAL_BASED_OUTPATIENT_CLINIC_OR_DEPARTMENT_OTHER): Payer: Medicare Other | Admitting: Hematology

## 2018-08-24 ENCOUNTER — Inpatient Hospital Stay: Payer: Medicare Other

## 2018-08-24 ENCOUNTER — Encounter: Payer: Self-pay | Admitting: Hematology

## 2018-08-24 VITALS — BP 127/57 | HR 51 | Temp 97.8°F | Resp 17 | Ht 65.0 in | Wt 177.1 lb

## 2018-08-24 DIAGNOSIS — Z803 Family history of malignant neoplasm of breast: Secondary | ICD-10-CM | POA: Diagnosis not present

## 2018-08-24 DIAGNOSIS — C250 Malignant neoplasm of head of pancreas: Secondary | ICD-10-CM

## 2018-08-24 DIAGNOSIS — Z79899 Other long term (current) drug therapy: Secondary | ICD-10-CM | POA: Diagnosis not present

## 2018-08-24 DIAGNOSIS — Z800 Family history of malignant neoplasm of digestive organs: Secondary | ICD-10-CM

## 2018-08-24 DIAGNOSIS — Z87891 Personal history of nicotine dependence: Secondary | ICD-10-CM | POA: Diagnosis not present

## 2018-08-24 DIAGNOSIS — F419 Anxiety disorder, unspecified: Secondary | ICD-10-CM | POA: Diagnosis not present

## 2018-08-24 DIAGNOSIS — J449 Chronic obstructive pulmonary disease, unspecified: Secondary | ICD-10-CM

## 2018-08-24 DIAGNOSIS — H9191 Unspecified hearing loss, right ear: Secondary | ICD-10-CM

## 2018-08-24 DIAGNOSIS — K219 Gastro-esophageal reflux disease without esophagitis: Secondary | ICD-10-CM | POA: Diagnosis not present

## 2018-08-24 DIAGNOSIS — G4733 Obstructive sleep apnea (adult) (pediatric): Secondary | ICD-10-CM | POA: Diagnosis not present

## 2018-08-24 DIAGNOSIS — I10 Essential (primary) hypertension: Secondary | ICD-10-CM

## 2018-08-24 LAB — CBC WITH DIFFERENTIAL (CANCER CENTER ONLY)
Basophils Absolute: 0 10*3/uL (ref 0.0–0.1)
Basophils Relative: 0 %
EOS ABS: 0.2 10*3/uL (ref 0.0–0.5)
EOS PCT: 3 %
HCT: 33.2 % — ABNORMAL LOW (ref 38.4–49.9)
Hemoglobin: 11.4 g/dL — ABNORMAL LOW (ref 13.0–17.1)
LYMPHS ABS: 2 10*3/uL (ref 0.9–3.3)
LYMPHS PCT: 31 %
MCH: 33.1 pg (ref 27.2–33.4)
MCHC: 34.3 g/dL (ref 32.0–36.0)
MCV: 96.5 fL (ref 79.3–98.0)
MONOS PCT: 6 %
Monocytes Absolute: 0.4 10*3/uL (ref 0.1–0.9)
Neutro Abs: 3.7 10*3/uL (ref 1.5–6.5)
Neutrophils Relative %: 60 %
PLATELETS: 172 10*3/uL (ref 140–400)
RBC: 3.44 MIL/uL — ABNORMAL LOW (ref 4.20–5.82)
RDW: 15.1 % — ABNORMAL HIGH (ref 11.0–14.6)
WBC Count: 6.3 10*3/uL (ref 4.0–10.3)

## 2018-08-24 LAB — CMP (CANCER CENTER ONLY)
ALBUMIN: 3.4 g/dL — AB (ref 3.5–5.0)
ALT: 32 U/L (ref 0–44)
AST: 21 U/L (ref 15–41)
Alkaline Phosphatase: 144 U/L — ABNORMAL HIGH (ref 38–126)
Anion gap: 7 (ref 5–15)
BUN: 18 mg/dL (ref 8–23)
CHLORIDE: 110 mmol/L (ref 98–111)
CO2: 25 mmol/L (ref 22–32)
CREATININE: 0.84 mg/dL (ref 0.61–1.24)
Calcium: 9.1 mg/dL (ref 8.9–10.3)
GFR, Est AFR Am: >60 mL/min (ref >60)
GFR, Estimated: >60 mL/min (ref >60)
GLUCOSE: 117 mg/dL — AB (ref 70–99)
POTASSIUM: 4.5 mmol/L (ref 3.5–5.1)
SODIUM: 142 mmol/L (ref 135–145)
Total Bilirubin: 1.9 mg/dL — ABNORMAL HIGH (ref 0.3–1.2)
Total Protein: 6.7 g/dL (ref 6.5–8.1)

## 2018-08-24 MED ORDER — CLONAZEPAM 0.5 MG PO TABS: 0.2500 mg | ORAL_TABLET | ORAL | 1 refills | Status: AC

## 2018-08-24 MED ORDER — HYDROXYZINE PAMOATE 50 MG PO CAPS: 50.0000 mg | ORAL_CAPSULE | Freq: Every day | ORAL | 1 refills | Status: AC | PRN

## 2018-08-24 MED ORDER — GABAPENTIN 100 MG PO CAPS: 100.0000 mg | ORAL_CAPSULE | ORAL | 3 refills | Status: DC

## 2018-08-24 NOTE — Progress Notes (Signed)
START ON PATHWAY REGIMEN - Pancreatic     A cycle is every 28 days:     Nab-paclitaxel (protein bound)      Gemcitabine   **Always confirm dose/schedule in your pharmacy ordering system**  Patient Characteristics: Adenocarcinoma, Resectable, Neoadjuvant Therapy Planned Histology: Adenocarcinoma Current evidence of distant metastases<= No AJCC T Category: T2 AJCC N Category: N0 AJCC M Category: M0 AJCC 8 Stage Grouping: IB Treatment Setting: Neoadjuvant Intent of Therapy: Curative Intent, Discussed with Patient

## 2018-08-24 NOTE — Telephone Encounter (Signed)
No 9/26 los.

## 2018-08-28 ENCOUNTER — Other Ambulatory Visit: Payer: Self-pay | Admitting: General Surgery

## 2018-08-28 DIAGNOSIS — C250 Malignant neoplasm of head of pancreas: Secondary | ICD-10-CM | POA: Diagnosis not present

## 2018-08-28 DIAGNOSIS — F329 Major depressive disorder, single episode, unspecified: Secondary | ICD-10-CM | POA: Diagnosis not present

## 2018-08-28 DIAGNOSIS — F419 Anxiety disorder, unspecified: Secondary | ICD-10-CM | POA: Diagnosis not present

## 2018-08-28 DIAGNOSIS — K8681 Exocrine pancreatic insufficiency: Secondary | ICD-10-CM | POA: Diagnosis not present

## 2018-08-29 ENCOUNTER — Encounter (HOSPITAL_BASED_OUTPATIENT_CLINIC_OR_DEPARTMENT_OTHER): Payer: Self-pay | Admitting: *Deleted

## 2018-08-29 ENCOUNTER — Telehealth: Payer: Self-pay | Admitting: *Deleted

## 2018-08-29 ENCOUNTER — Other Ambulatory Visit: Payer: Self-pay

## 2018-08-29 NOTE — Telephone Encounter (Signed)
Nathaniel Lee, could you refer him to Dr. Hinton Rao and fax his records? He needs to be seen within one week. Let me now if there is a problem, and leave my phone number [phone] on the cover sheet, thanks   Truitt Merle MD

## 2018-08-29 NOTE — Telephone Encounter (Signed)
Received vm call from Michel Harrow asking for return call. She states that they would like to move pt's treatment to [facility] with Dr Hinton Rao.  They have planned port for 10/15 with Dr. Barry Dienes. Informed that we would let Dr Burr Medico know.  Message routed.

## 2018-08-30 ENCOUNTER — Telehealth: Payer: Self-pay | Admitting: Hematology

## 2018-08-30 NOTE — Telephone Encounter (Signed)
Faxed  Records to Dr. Remi Deter office.  Dr Hinton Rao will review the records and [facility] from Dr. Remi Deter office will call pt with appt.

## 2018-08-31 ENCOUNTER — Telehealth: Payer: Self-pay

## 2018-08-31 NOTE — Telephone Encounter (Signed)
Faxed path report to [facility] ([place]) [phone]

## 2018-09-01 NOTE — Progress Notes (Signed)
Chart information faxed to [facility] at [facility]. Spoke with patient's daughter, patient has apt with Dr. Hinton Rao on 09/04/18.

## 2018-09-04 DIAGNOSIS — C250 Malignant neoplasm of head of pancreas: Secondary | ICD-10-CM | POA: Diagnosis not present

## 2018-09-04 DIAGNOSIS — C257 Malignant neoplasm of other parts of pancreas: Secondary | ICD-10-CM | POA: Diagnosis not present

## 2018-09-04 DIAGNOSIS — Z7901 Long term (current) use of anticoagulants: Secondary | ICD-10-CM | POA: Diagnosis not present

## 2018-09-04 DIAGNOSIS — Z803 Family history of malignant neoplasm of breast: Secondary | ICD-10-CM | POA: Diagnosis not present

## 2018-09-11 NOTE — H&P (Signed)
Nathaniel Lee Documented: 08/28/2018 9:29 AM Location: [facility] Patient #: [id number] DOB: 05/08/1946 Undefined / Language: Nathaniel Lee / Race: White Male   History of Present Illness Stark Klein MD; 08/28/2018 10:49 AM) The patient is a 72 year old male who presents with pancreatic cancer. Pt is a 72 yo M referred by Dr. 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 He presented to urgent care with jaundice, diarrhea, and weight loss. He had seen his PCP around 4-6 weeks previously and had normal labs. Once his bilirubin was found to be dramatically elevated, his PCP (Dr. Lin Landsman) referred him to ED for further workup. [facility] He underwent EUS and ERCP/stenting. Biopsies were positive for cancer. He was also having itching while he was jaundiced, but this has resolved.   He has lost around 8-10 pounds. He has OSA and uses CPAP. He also has mild COPD. He is feeling much better after stent placement. His jaundice has nearly resolved. He has stopped smoking with the diagnosis. He is fairly active. He goes to the gym around 5-6 times per week. He does not do an intense workout, but does work out some.   His mother had breast cancer and another ? uncle with cancer.   CA 19-9 325 HCT 33.2 T bili 19.5 down to 1.9 Albumin 3.4  Pathology 08/04/2018 Diagnosis FINE NEEDLE ASPIRATION, ENDOSCOPIC, PANCREAS, HEAD (SPECIMEN 1 OF 1 COLLECTED 08/04/18): MALIGNANT CELLS CONSISTENT WITH ADENOCARCINOMA.   Procedure  08/04/2018 Upper Endoscopy Findings: An irregular mass was identified in the pancreatic head and uncinate process of the pancreas. The mass was hypoechoic. The mass measured 37 mm by 20 mm in maximal cross-sectional diameter. The endosonographic borders were well-defined. An intact interface was seen between the mass and the superior mesenteric artery and celiac trunk suggesting a lack of invasion. The  remainder of the pancreas was examined. The endosonographic appearance of parenchyma and the upstream pancreatic duct indicated duct dilation and a maximum duct diameter of 6 mm. Fine needle aspiration for cytology was performed. Color Doppler imaging was utilized prior to needle puncture to confirm a lack of significant vascular structures within the needle path. Five passes were made with the 25 gauge needle using a transduodenal approach. A stylet was used. A cytotechnologist was present to evaluate the adequacy of the specimen. The cellularity of the specimen was adequate. Final cytology results are pending. The large and irregularly, but well-defined hypoechoic pancreatic head mass was readily identified. There was no overt invasion in the surrounding vessels. Artifact from the metallic biliary stent precluded a complete view of the portal vein, but it did not appear that the mass involved this area. The celiac axis and SMA were negative for any evidence of tumor involvement or lymph nodes. Two to 3 peripancreatic lymph nodes were identified, with the largest measuring approximately 1 cm. The left adrenal gland was normal and there was no overt evidence of any hepatic involvement in the left lobe of the liver.  Impression - A mass was identified in the pancreatic head and uncinate process of the pancreas. Fine needle aspiration performed.  ERCP with Carol Ada, MD on 07/30/2018 Findings: The major papilla was normal. A short 0.035 inch Soft Jagwire was passed into the biliary tree. A 5 mm biliary sphincterotomy was made with a monofilament traction (standard) sphincterotome using ERBE electrocautery. The sphincterotomy oozed blood. One 10 mm by 4 cm covered metal stent with no external flaps and  no internal flaps was placed 4.5 cm into the common bile duct. Bile and sludge flowed through the stent. The stent was in good position. Looping was encountered with advancement of the  duodenoscope from the gastric lumen into the duodenal lumen. In the first and second portions of the duodenum there was evidence of superficial scattered ulcerations. The ampulla was identified hiding in some duodenal folds. During the first attempt the CBD was successfully cannulated. The guidewire was secured in the right intrahepatic ducts. Contrast injection revealed a dilated CBD at 12-15 mm and there was a filling defect in the mid CBD consistent with a stone. In the distal CBD There was evidence of a 3-4 cm stricture. The intrahepatic ducts also exhibited dilation. A 5-8 mm sphincterotomy was created, but it was not extended further as the adjacent duodenal folds were being cauterized with extension of the sphincterotomy. Minor oozing was encountered. A 10 mm x 40 mm covered metallic stent was inserted with ease and successfully deployed. A copious amount of dark bile and sludge exited the stent.  Impression: - The major papilla appeared normal. - A biliary sphincterotomy was performed. - One covered metal stent was placed into the common bile duct.  CT chest 08/21/2018 IMPRESSION: 1. No evidence of metastatic disease in the chest. 2. Aortic atherosclerosis (ICD10-170.0). Three-vessel coronary artery calcification.  Ct abd/pelvis 07/29/2018 [facility], in PACS IMPRESSION: 1. CT findings concerning for 2.1x2.3 cm panc adenoca in distal head/uncinate process of the pancreas resulting in malignant biliary obstruction. Focal narrowing of the SMV as it passes by suspected panc mass suggesting venous encasement. no evidence of arterial involvement. 2. no convincing evidence of metastatic disease 3. Sludge in gallbladder REnal cysts 4. colonic diverticuli 5. aortic atherosclerosis 6. advanced multilevel degenerative disc disease and facet arthropathy.    Past Surgical History (April Staton, CMA; 08/28/2018 9:29 AM) Colon Polyp Removal - Colonoscopy  Shoulder Surgery   Right.  Diagnostic Studies History (April Staton, [place]; 08/28/2018 9:29 AM) Colonoscopy  5-10 years ago  Allergies (April Staton, CMA; 08/28/2018 9:31 AM) No Known Drug Allergies [08/28/2018]:  Medication History (April Staton, CMA; 08/28/2018 9:35 AM) Albuterol Sulfate HFA (108 (90 Base)MCG/ACT Aerosol Soln, Inhalation) Active. Amitriptyline HCl (50MG  Tablet, Oral) Active. Aspirin (81MG  Tablet, Oral) Active. Vitamin D (1000UNIT Tablet, Oral) Active. clonazePAM (0.5MG  Tablet, Oral) Active. Docusate Sodium (50MG  Capsule, Oral) Active. Iron (325 (65 Fe)MG Tablet, Oral) Active. Gabapentin (100MG  Capsule, Oral) Active. hydrOXYzine Pamoate (50MG  Capsule, Oral) Active. Lisinopril (40MG  Tablet, Oral) Active. Multi-Vitamin (Oral) Active. MiraLax (Oral) Active. Flomax (0.4MG  Capsule, Oral) Active. Venlafaxine HCl ER (75MG  Capsule ER 24HR, Oral) Active. tiZANidine HCl (2MG  Capsule, Oral) Active. Medications Reconciled  Social History (April Staton, [place]; 08/28/2018 9:29 AM) Alcohol use  Remotely quit alcohol use. Caffeine use  Carbonated beverages, Coffee. Tobacco use  Former smoker.  Family History (April Staton, [place]; 08/28/2018 9:29 AM) Arthritis  Mother. Breast Cancer  Mother. Cancer  Mother. Diabetes Mellitus  Brother. Hypertension  Brother. Malignant Neoplasm Of Pancreas  Family Members In General.  Other Problems (April Staton, CMA; 08/28/2018 9:29 AM) Anxiety Disorder  Arthritis  Depression  Enlarged Prostate  High blood pressure  Hypercholesterolemia  Pancreatic Cancer  Sleep Apnea  Umbilical Hernia Repair     Review of Systems (April Staton CMA; 08/28/2018 9:29 AM) General Present- Fatigue and Weight Loss. Not Present- Appetite Loss, Chills, Fever, Night Sweats and Weight Gain. Skin Present- Jaundice. Not Present- Change in Wart/Mole, Dryness, Hives, New Lesions, Non-Healing Wounds, Rash and Ulcer.  HEENT Present- Hearing Loss, Wears  glasses/contact lenses and Yellow Eyes. Not Present- Earache, Hoarseness, Nose Bleed, Oral Ulcers, Ringing in the Ears, Seasonal Allergies, Sinus Pain, Sore Throat and Visual Disturbances. Cardiovascular Present- Shortness of Breath. Not Present- Chest Pain, Difficulty Breathing Lying Down, Leg Cramps, Palpitations, Rapid Heart Rate and Swelling of Extremities. Gastrointestinal Present- Change in Bowel Habits, Chronic diarrhea and Indigestion. Not Present- Abdominal Pain, Bloating, Bloody Stool, Constipation, Difficulty Swallowing, Excessive gas, Gets full quickly at meals, Hemorrhoids, Nausea, Rectal Pain and Vomiting. Male Genitourinary Present- Impotence. Not Present- Blood in Urine, Change in Urinary Stream, Frequency, Nocturia, Painful Urination, Urgency and Urine Leakage.  Vitals (April Staton CMA; 08/28/2018 9:36 AM) 08/28/2018 9:35 AM Weight: 180 lb Height: 65in Body Surface Area: 1.89 m Body Mass Index: 29.95 kg/m  Temp.: 98.20F(Oral)  Pulse: 62 (Regular)  BP: 118/62 (Sitting, Left Arm, Standard)       Physical Exam Stark Klein MD; 08/28/2018 10:46 AM) General Mental Status-Alert. General Appearance-Consistent with stated age. Hydration-Well hydrated. Voice-Normal.  Head and Neck Head-normocephalic, atraumatic with no lesions or palpable masses. Trachea-midline. Thyroid Gland Characteristics - normal size and consistency.  Eye Eyeball - Bilateral-Extraocular movements intact. Sclera/Conjunctiva - Bilateral-No scleral icterus.  Chest and Lung Exam Chest and lung exam reveals -quiet, even and easy respiratory effort with no use of accessory muscles and on auscultation, normal breath sounds, no adventitious sounds and normal vocal resonance. Inspection Chest Wall - Normal. Back - normal.  Cardiovascular Cardiovascular examination reveals -normal heart sounds, regular rate and rhythm with no murmurs and normal pedal pulses  bilaterally.  Abdomen Inspection Inspection of the abdomen reveals - No Hernias. Palpation/Percussion Palpation and Percussion of the abdomen reveal - Soft, Non Tender, No Rebound tenderness, No Rigidity (guarding) and No hepatosplenomegaly. Auscultation Auscultation of the abdomen reveals - Bowel sounds normal. Note: umbilical hernia   Neurologic Neurologic evaluation reveals -alert and oriented x 3 with no impairment of recent or remote memory. Mental Status-Normal.  Musculoskeletal Global Assessment -Note: no gross deformities.  Normal Exam - Left-Upper Extremity Strength Normal and Lower Extremity Strength Normal. Normal Exam - Right-Upper Extremity Strength Normal and Lower Extremity Strength Normal.  Lymphatic Head & Neck  General Head & Neck Lymphatics: Bilateral - Description - Normal. Axillary  General Axillary Region: Bilateral - Description - Normal. Tenderness - Non Tender. Femoral & Inguinal  Generalized Femoral & Inguinal Lymphatics: Bilateral - Description - No Generalized lymphadenopathy.    Assessment & Plan Stark Klein MD; 08/28/2018 10:48 AM) ADENOCARCINOMA OF HEAD OF PANCREAS (C25.0) Impression: I agree with neoadjuvant chemotherapy given significant narrowing of portal vein. Will plan port placement with short course of therapy followed by restaging scans and likely whipple.  I reviewed port placement with patient and family. I discussed risks. I will plan to see after his chemo and restaging scans for pre op visit.  I also reviewed anatomy of the cancer and what happens with surgery. We briefly discussed risks of surgery and recovery, etc. He is on board with the plan.  I discussed the natural history of pancreatic cancer and high risk of recurrence.  I reviewed the issues with his SMV and why this makes his surgery a bit more problematic. Current Plans You are being scheduled for surgery- Our schedulers will call you.  You should  hear from our office's scheduling department within 5 working days about the location, date, and time of surgery. We try to make accommodations for patient's preferences in scheduling surgery, but sometimes the OR  schedule or the surgeon's schedule prevents [place] from making those accommodations.  If you have not heard from our office [phone]) in 5 working days, call the office and ask for your surgeon's nurse.  If you have other questions about your diagnosis, plan, or surgery, call the office and ask for your surgeon's nurse.  Pt Education - ccs port insertion education Pt Education - flb whipple pt info Follow up with [place] in the office in 3 MONTHS.  Call us sooner as needed.  ANXIETY AND DEPRESSION (F41.9) Impression: Pt given 1 month refill on home medication to bridge time to get his regular doc to refill Current Plans Started Venlafaxine HCl 75 MG Oral Tablet, 1 (one) Tablet daily, #30, 30 days starting 08/28/2018, No Refill. 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 Given some creon samples and script wtih coupon for refills. Current Plans Started Creon 36000 UNIT Oral Capsule Delayed Release Particles, as directed, #240, 30 days starting 08/28/2018, Ref. x12. Local Order: 2-3 caps with meals and 1 with snacks. Rx Info (Pt Education) - RxInfo - ScriptGuide - Creon (RxInfo - ScriptGuide - Creon)   Signed by Stark Klein, MD (08/28/2018 10:50 AM)

## 2018-09-12 ENCOUNTER — Ambulatory Visit (HOSPITAL_COMMUNITY): Payer: Medicare Other

## 2018-09-12 ENCOUNTER — Encounter (HOSPITAL_BASED_OUTPATIENT_CLINIC_OR_DEPARTMENT_OTHER)
Admission: RE | Disposition: A | Discharge status: Home / Self Care | Payer: Self-pay | Source: Ambulatory Visit | Attending: General Surgery

## 2018-09-12 ENCOUNTER — Ambulatory Visit (HOSPITAL_BASED_OUTPATIENT_CLINIC_OR_DEPARTMENT_OTHER)
Admission: RE | Admit: 2018-09-12 | Discharge: 2018-09-12 | Disposition: A | Discharge status: Home / Self Care | Payer: Medicare Other | Source: Ambulatory Visit | Attending: General Surgery | Admitting: General Surgery

## 2018-09-12 ENCOUNTER — Other Ambulatory Visit: Payer: Self-pay

## 2018-09-12 ENCOUNTER — Ambulatory Visit (HOSPITAL_BASED_OUTPATIENT_CLINIC_OR_DEPARTMENT_OTHER): Payer: Medicare Other | Admitting: Certified Registered Nurse Anesthetist

## 2018-09-12 ENCOUNTER — Encounter (HOSPITAL_BASED_OUTPATIENT_CLINIC_OR_DEPARTMENT_OTHER): Payer: Self-pay | Admitting: Certified Registered Nurse Anesthetist

## 2018-09-12 DIAGNOSIS — M1990 Unspecified osteoarthritis, unspecified site: Secondary | ICD-10-CM | POA: Diagnosis not present

## 2018-09-12 DIAGNOSIS — F419 Anxiety disorder, unspecified: Secondary | ICD-10-CM | POA: Diagnosis not present

## 2018-09-12 DIAGNOSIS — K219 Gastro-esophageal reflux disease without esophagitis: Secondary | ICD-10-CM | POA: Diagnosis not present

## 2018-09-12 DIAGNOSIS — C250 Malignant neoplasm of head of pancreas: Secondary | ICD-10-CM | POA: Diagnosis not present

## 2018-09-12 DIAGNOSIS — K8681 Exocrine pancreatic insufficiency: Secondary | ICD-10-CM | POA: Insufficient documentation

## 2018-09-12 DIAGNOSIS — G4733 Obstructive sleep apnea (adult) (pediatric): Secondary | ICD-10-CM | POA: Diagnosis not present

## 2018-09-12 DIAGNOSIS — Z6829 Body mass index (BMI) 29.0-29.9, adult: Secondary | ICD-10-CM | POA: Diagnosis not present

## 2018-09-12 DIAGNOSIS — J449 Chronic obstructive pulmonary disease, unspecified: Secondary | ICD-10-CM | POA: Diagnosis not present

## 2018-09-12 DIAGNOSIS — Z87891 Personal history of nicotine dependence: Secondary | ICD-10-CM | POA: Insufficient documentation

## 2018-09-12 DIAGNOSIS — E7800 Pure hypercholesterolemia, unspecified: Secondary | ICD-10-CM | POA: Insufficient documentation

## 2018-09-12 DIAGNOSIS — Z452 Encounter for adjustment and management of vascular access device: Secondary | ICD-10-CM | POA: Diagnosis not present

## 2018-09-12 DIAGNOSIS — I2510 Atherosclerotic heart disease of native coronary artery without angina pectoris: Secondary | ICD-10-CM | POA: Insufficient documentation

## 2018-09-12 DIAGNOSIS — Z95828 Presence of other vascular implants and grafts: Secondary | ICD-10-CM

## 2018-09-12 DIAGNOSIS — F329 Major depressive disorder, single episode, unspecified: Secondary | ICD-10-CM | POA: Diagnosis not present

## 2018-09-12 DIAGNOSIS — E669 Obesity, unspecified: Secondary | ICD-10-CM | POA: Insufficient documentation

## 2018-09-12 DIAGNOSIS — I10 Essential (primary) hypertension: Secondary | ICD-10-CM | POA: Insufficient documentation

## 2018-09-12 DIAGNOSIS — Z7982 Long term (current) use of aspirin: Secondary | ICD-10-CM | POA: Insufficient documentation

## 2018-09-12 DIAGNOSIS — I700 Atherosclerosis of aorta: Secondary | ICD-10-CM | POA: Diagnosis not present

## 2018-09-12 DIAGNOSIS — N281 Cyst of kidney, acquired: Secondary | ICD-10-CM | POA: Diagnosis not present

## 2018-09-12 DIAGNOSIS — J90 Pleural effusion, not elsewhere classified: Secondary | ICD-10-CM | POA: Diagnosis not present

## 2018-09-12 DIAGNOSIS — J9811 Atelectasis: Secondary | ICD-10-CM | POA: Diagnosis not present

## 2018-09-12 DIAGNOSIS — Z4682 Encounter for fitting and adjustment of non-vascular catheter: Secondary | ICD-10-CM | POA: Diagnosis not present

## 2018-09-12 HISTORY — DX: Malignant (primary) neoplasm, unspecified: C80.1

## 2018-09-12 HISTORY — PX: PORTACATH PLACEMENT: SHX2246

## 2018-09-12 SURGERY — INSERTION, TUNNELED CENTRAL VENOUS DEVICE, WITH PORT
Anesthesia: General | Site: Chest | Laterality: Left

## 2018-09-12 MED ORDER — HEPARIN (PORCINE) IN NACL 2-0.9 UNITS/ML
INTRAMUSCULAR | Status: AC | PRN
  Administered 2018-09-12: 1 via INTRAVENOUS

## 2018-09-12 MED ORDER — OXYCODONE HCL 5 MG/5ML PO SOLN: 5.0000 mg | Freq: Once | ORAL | Status: DC | PRN

## 2018-09-12 MED ORDER — BUPIVACAINE-EPINEPHRINE 0.25% -1:200000 IJ SOLN
INTRAMUSCULAR | Status: DC | PRN
  Administered 2018-09-12: 10 mL

## 2018-09-12 MED ORDER — PHENYLEPHRINE HCL 10 MG/ML IJ SOLN
INTRAMUSCULAR | Status: DC | PRN
  Administered 2018-09-12: 80 ug via INTRAVENOUS

## 2018-09-12 MED ORDER — PHENYLEPHRINE 40 MCG/ML (10ML) SYRINGE FOR IV PUSH (FOR BLOOD PRESSURE SUPPORT)
PREFILLED_SYRINGE | INTRAVENOUS | Status: AC
  Filled 2018-09-12: qty 10

## 2018-09-12 MED ORDER — LIDOCAINE HCL (CARDIAC) PF 100 MG/5ML IV SOSY
PREFILLED_SYRINGE | INTRAVENOUS | Status: DC | PRN
  Administered 2018-09-12: 50 mg via INTRAVENOUS

## 2018-09-12 MED ORDER — LIDOCAINE-EPINEPHRINE (PF) 1 %-1:200000 IJ SOLN
INTRAMUSCULAR | Status: AC
  Filled 2018-09-12: qty 60

## 2018-09-12 MED ORDER — ACETAMINOPHEN 500 MG PO TABS
1000.0000 mg | ORAL_TABLET | ORAL | Status: AC
  Administered 2018-09-12: 1000 mg via ORAL

## 2018-09-12 MED ORDER — PROPOFOL 10 MG/ML IV BOLUS
INTRAVENOUS | Status: AC
  Filled 2018-09-12: qty 20

## 2018-09-12 MED ORDER — ONDANSETRON HCL 4 MG/2ML IJ SOLN
INTRAMUSCULAR | Status: DC | PRN
  Administered 2018-09-12: 4 mg via INTRAVENOUS

## 2018-09-12 MED ORDER — SCOPOLAMINE 1 MG/3DAYS TD PT72: 1.0000 | MEDICATED_PATCH | Freq: Once | TRANSDERMAL | Status: DC | PRN

## 2018-09-12 MED ORDER — CHLORHEXIDINE GLUCONATE CLOTH 2 % EX PADS: 6.0000 | MEDICATED_PAD | Freq: Once | CUTANEOUS | Status: DC

## 2018-09-12 MED ORDER — ONDANSETRON HCL 4 MG/2ML IJ SOLN
INTRAMUSCULAR | Status: AC
  Filled 2018-09-12: qty 2

## 2018-09-12 MED ORDER — HEPARIN (PORCINE) IN NACL 1000-0.9 UT/500ML-% IV SOLN
INTRAVENOUS | Status: AC
  Filled 2018-09-12: qty 500

## 2018-09-12 MED ORDER — PROMETHAZINE HCL 25 MG/ML IJ SOLN: 6.2500 mg | INTRAMUSCULAR | Status: DC | PRN

## 2018-09-12 MED ORDER — MIDAZOLAM HCL 5 MG/5ML IJ SOLN
INTRAMUSCULAR | Status: DC | PRN
  Administered 2018-09-12: 1 mg via INTRAVENOUS

## 2018-09-12 MED ORDER — CEFAZOLIN SODIUM-DEXTROSE 2-4 GM/100ML-% IV SOLN
INTRAVENOUS | Status: AC
  Filled 2018-09-12: qty 100

## 2018-09-12 MED ORDER — FENTANYL CITRATE (PF) 100 MCG/2ML IJ SOLN
INTRAMUSCULAR | Status: AC
  Filled 2018-09-12: qty 2

## 2018-09-12 MED ORDER — MIDAZOLAM HCL 2 MG/2ML IJ SOLN: 1.0000 mg | INTRAMUSCULAR | Status: DC | PRN

## 2018-09-12 MED ORDER — BUPIVACAINE HCL (PF) 0.5 % IJ SOLN
INTRAMUSCULAR | Status: AC
  Filled 2018-09-12: qty 60

## 2018-09-12 MED ORDER — FENTANYL CITRATE (PF) 100 MCG/2ML IJ SOLN
INTRAMUSCULAR | Status: DC | PRN
  Administered 2018-09-12 (×2): 25 ug via INTRAVENOUS
  Administered 2018-09-12: 50 ug via INTRAVENOUS

## 2018-09-12 MED ORDER — OXYCODONE HCL 5 MG PO TABS: 5.0000 mg | ORAL_TABLET | Freq: Four times a day (QID) | ORAL | 0 refills | Status: AC | PRN

## 2018-09-12 MED ORDER — ACETAMINOPHEN 500 MG PO TABS
ORAL_TABLET | ORAL | Status: AC
  Filled 2018-09-12: qty 2

## 2018-09-12 MED ORDER — GABAPENTIN 300 MG PO CAPS
300.0000 mg | ORAL_CAPSULE | ORAL | Status: AC
  Administered 2018-09-12: 300 mg via ORAL

## 2018-09-12 MED ORDER — BUPIVACAINE-EPINEPHRINE (PF) 0.25% -1:200000 IJ SOLN
INTRAMUSCULAR | Status: AC
  Filled 2018-09-12: qty 30

## 2018-09-12 MED ORDER — DEXAMETHASONE SODIUM PHOSPHATE 4 MG/ML IJ SOLN
INTRAMUSCULAR | Status: DC | PRN
  Administered 2018-09-12: 5 mg via INTRAVENOUS

## 2018-09-12 MED ORDER — HEPARIN SOD (PORK) LOCK FLUSH 100 UNIT/ML IV SOLN
INTRAVENOUS | Status: DC | PRN
  Administered 2018-09-12: 500 [IU] via INTRAVENOUS

## 2018-09-12 MED ORDER — OXYCODONE HCL 5 MG PO TABS: 5.0000 mg | ORAL_TABLET | Freq: Once | ORAL | Status: DC | PRN

## 2018-09-12 MED ORDER — BUPIVACAINE HCL (PF) 0.25 % IJ SOLN
INTRAMUSCULAR | Status: AC
  Filled 2018-09-12: qty 30

## 2018-09-12 MED ORDER — PROPOFOL 10 MG/ML IV BOLUS
INTRAVENOUS | Status: DC | PRN
  Administered 2018-09-12: 110 mg via INTRAVENOUS

## 2018-09-12 MED ORDER — CEFAZOLIN SODIUM-DEXTROSE 2-4 GM/100ML-% IV SOLN
2.0000 g | INTRAVENOUS | Status: AC
  Administered 2018-09-12: 2 g via INTRAVENOUS

## 2018-09-12 MED ORDER — LACTATED RINGERS IV SOLN
INTRAVENOUS | Status: DC
  Administered 2018-09-12 (×2): via INTRAVENOUS

## 2018-09-12 MED ORDER — FENTANYL CITRATE (PF) 100 MCG/2ML IJ SOLN: 50.0000 ug | INTRAMUSCULAR | Status: DC | PRN

## 2018-09-12 MED ORDER — KETOROLAC TROMETHAMINE 30 MG/ML IJ SOLN
INTRAMUSCULAR | Status: AC
  Filled 2018-09-12: qty 1

## 2018-09-12 MED ORDER — HYDROMORPHONE HCL 1 MG/ML IJ SOLN: 0.2500 mg | INTRAMUSCULAR | Status: DC | PRN

## 2018-09-12 MED ORDER — MIDAZOLAM HCL 2 MG/2ML IJ SOLN
INTRAMUSCULAR | Status: AC
  Filled 2018-09-12: qty 2

## 2018-09-12 MED ORDER — HEPARIN SOD (PORK) LOCK FLUSH 100 UNIT/ML IV SOLN
INTRAVENOUS | Status: AC
  Filled 2018-09-12: qty 5

## 2018-09-12 MED ORDER — GABAPENTIN 300 MG PO CAPS
ORAL_CAPSULE | ORAL | Status: AC
  Filled 2018-09-12: qty 1

## 2018-09-12 MED ORDER — LIDOCAINE 2% (20 MG/ML) 5 ML SYRINGE
INTRAMUSCULAR | Status: AC
  Filled 2018-09-12: qty 5

## 2018-09-12 SURGICAL SUPPLY — 48 items
BAG DECANTER FOR FLEXI CONT (MISCELLANEOUS) ×3 IMPLANT
BLADE HEX COATED 2.75 (ELECTRODE) ×3 IMPLANT
BLADE SURG 11 STRL SS (BLADE) ×3 IMPLANT
BLADE SURG 15 STRL LF DISP TIS (BLADE) ×1 IMPLANT
BLADE SURG 15 STRL SS (BLADE) ×2
CHLORAPREP W/TINT 26ML (MISCELLANEOUS) ×3 IMPLANT
COVER BACK TABLE 60X90IN (DRAPES) ×3 IMPLANT
COVER MAYO STAND STRL (DRAPES) ×3 IMPLANT
COVER WAND RF STERILE (DRAPES) IMPLANT
DECANTER SPIKE VIAL GLASS SM (MISCELLANEOUS) IMPLANT
DERMABOND ADVANCED (GAUZE/BANDAGES/DRESSINGS) ×2
DERMABOND ADVANCED .7 DNX12 (GAUZE/BANDAGES/DRESSINGS) ×1 IMPLANT
DRAPE C-ARM 42X72 X-RAY (DRAPES) ×3 IMPLANT
DRAPE LAPAROTOMY TRNSV 102X78 (DRAPE) ×3 IMPLANT
DRAPE UTILITY XL STRL (DRAPES) ×3 IMPLANT
DRSG TEGADERM 4X4.75 (GAUZE/BANDAGES/DRESSINGS) IMPLANT
ELECT REM PT RETURN 9FT ADLT (ELECTROSURGICAL) ×3
ELECTRODE REM PT RTRN 9FT ADLT (ELECTROSURGICAL) ×1 IMPLANT
GAUZE SPONGE 4X4 12PLY STRL LF (GAUZE/BANDAGES/DRESSINGS) IMPLANT
GLOVE BIO SURGEON STRL SZ 6 (GLOVE) ×3 IMPLANT
GLOVE BIOGEL PI IND STRL 6.5 (GLOVE) ×1 IMPLANT
GLOVE BIOGEL PI IND STRL 7.5 (GLOVE) ×1 IMPLANT
GLOVE BIOGEL PI INDICATOR 6.5 (GLOVE) ×2
GLOVE BIOGEL PI INDICATOR 7.5 (GLOVE) ×2
GLOVE EXAM NITRILE MD LF STRL (GLOVE) ×3 IMPLANT
GLOVE SURG SYN 7.5  E (GLOVE) ×2
GLOVE SURG SYN 7.5 E (GLOVE) ×1 IMPLANT
GOWN STRL REUS W/ TWL LRG LVL3 (GOWN DISPOSABLE) IMPLANT
GOWN STRL REUS W/ TWL XL LVL3 (GOWN DISPOSABLE) ×1 IMPLANT
GOWN STRL REUS W/TWL 2XL LVL3 (GOWN DISPOSABLE) ×3 IMPLANT
GOWN STRL REUS W/TWL LRG LVL3 (GOWN DISPOSABLE)
GOWN STRL REUS W/TWL XL LVL3 (GOWN DISPOSABLE) ×2
IV CONNECTOR ONE LINK NDLESS (IV SETS) IMPLANT
KIT PORT POWER 8FR ISP CVUE (Port) ×3 IMPLANT
NEEDLE HYPO 25X1 1.5 SAFETY (NEEDLE) ×3 IMPLANT
PACK BASIN DAY SURGERY FS (CUSTOM PROCEDURE TRAY) ×3 IMPLANT
PENCIL BUTTON HOLSTER BLD 10FT (ELECTRODE) ×3 IMPLANT
SLEEVE SCD COMPRESS KNEE MED (MISCELLANEOUS) ×3 IMPLANT
SUT MNCRL AB 4-0 PS2 18 (SUTURE) ×3 IMPLANT
SUT PROLENE 2 0 SH DA (SUTURE) ×6 IMPLANT
SUT VIC AB 3-0 SH 27 (SUTURE) ×2
SUT VIC AB 3-0 SH 27X BRD (SUTURE) ×1 IMPLANT
SUT VICRYL 3-0 CR8 SH (SUTURE) IMPLANT
SYR 10ML LL (SYRINGE) ×3 IMPLANT
SYR 5ML LUER SLIP (SYRINGE) ×3 IMPLANT
SYR CONTROL 10ML LL (SYRINGE) ×3 IMPLANT
TOWEL GREEN STERILE FF (TOWEL DISPOSABLE) ×3 IMPLANT
TOWEL OR NON WOVEN STRL DISP B (DISPOSABLE) IMPLANT

## 2018-09-12 NOTE — Anesthesia Postprocedure Evaluation (Signed)
Anesthesia Post Note  Patient: Nathaniel Lee  Procedure(s) Performed: INSERTION PORT-A-CATH (Left Chest)     Patient location during evaluation: PACU Anesthesia Type: General Level of consciousness: awake and alert Pain management: pain level controlled Vital Signs Assessment: post-procedure vital signs reviewed and stable Respiratory status: spontaneous breathing, nonlabored ventilation and respiratory function stable Cardiovascular status: blood pressure returned to baseline and stable Postop Assessment: no apparent nausea or vomiting Anesthetic complications: no    Last Vitals:  Vitals:   09/12/18 1515 09/12/18 1550  BP: (!) 155/74 (!) 180/73  Pulse: (!) 57 (!) 52  Resp: 19 18  Temp:  36.6 C  SpO2: 97% 100%    Last Pain:  Vitals:   09/12/18 1550  TempSrc:   PainSc: 0-No pain                 Lynda Rainwater

## 2018-09-12 NOTE — Anesthesia Preprocedure Evaluation (Signed)
Anesthesia Evaluation  Patient identified by MRN, date of birth, ID band Patient awake    Reviewed: Allergy & Precautions, NPO status , Patient's Chart, lab work & pertinent test results  Airway Mallampati: II  TM Distance: >3 FB Neck ROM: Full    Dental  (+) Teeth Intact, Dental Advisory Given   Pulmonary sleep apnea and Continuous Positive Airway Pressure Ventilation , former smoker,    breath sounds clear to auscultation       Cardiovascular hypertension, Pt. on medications  Rhythm:Regular Rate:Normal     Neuro/Psych Anxiety Depression    GI/Hepatic Neg liver ROS, GERD  ,  Endo/Other  negative endocrine ROS  Renal/GU negative Renal ROS     Musculoskeletal  (+) Arthritis , Osteoarthritis,    Abdominal (+) + obese,   Peds  Hematology negative hematology ROS (+) anemia ,   Anesthesia Other Findings Pancreatic Cancer  Reproductive/Obstetrics                             Anesthesia Physical  Anesthesia Plan  ASA: III  Anesthesia Plan: General   Post-op Pain Management:    Induction: Intravenous  PONV Risk Score and Plan: 2 and Ondansetron and Midazolam  Airway Management Planned: LMA  Additional Equipment: None  Intra-op Plan:   Post-operative Plan: Extubation in OR  Informed Consent: I have reviewed the patients History and Physical, chart, labs and discussed the procedure including the risks, benefits and alternatives for the proposed anesthesia with the patient or authorized representative who has indicated his/her understanding and acceptance.   Dental advisory given  Plan Discussed with: CRNA  Anesthesia Plan Comments:         Anesthesia Quick Evaluation

## 2018-09-12 NOTE — Anesthesia Procedure Notes (Signed)
Procedure Name: LMA Insertion Date/Time: 09/12/2018 2:05 PM Performed by: Bufford Spikes, CRNA Pre-anesthesia Checklist: Patient identified, Emergency Drugs available, Suction available and Patient being monitored Patient Re-evaluated:Patient Re-evaluated prior to induction Oxygen Delivery Method: Circle system utilized Preoxygenation: Pre-oxygenation with 100% oxygen Induction Type: IV induction Ventilation: Mask ventilation without difficulty LMA: LMA inserted LMA Size: 5.0 Number of attempts: 1 Airway Equipment and Method: Bite block Placement Confirmation: positive ETCO2 and breath sounds checked- equal and bilateral Tube secured with: Tape Dental Injury: Teeth and Oropharynx as per pre-operative assessment

## 2018-09-12 NOTE — Op Note (Signed)
PREOPERATIVE DIAGNOSIS:  Adenocarcinoma of the pancreatic head, cT2N0     POSTOPERATIVE DIAGNOSIS:  Same     PROCEDURE: Left subclavian port placement, Bard ClearVue Power Port, MRI safe, 8-French.      SURGEON:  Stark Klein, MD      ANESTHESIA:  General   FINDINGS:  Good venous return, easy flush, and tip of the catheter and   SVC 24 cm.      SPECIMEN:  None.      ESTIMATED BLOOD LOSS:  Minimal.      COMPLICATIONS:  None known.      PROCEDURE:  Pt was identified in the holding area and taken to   the operating room, where patient was placed supine on the operating room   table.  General anesthesia was induced.  Patient's arms were tucked and the upper   chest and neck were prepped and draped in sterile fashion.  Time-out was   performed according to the surgical safety check list.  When all was   correct, we continued.   Local anesthetic was administered over this   area at the angle of the clavicle.  The vein was accessed with 1 pass(es) of the needle. There was good venous return and the wire passed easily with no ectopy.   Fluoroscopy was used to confirm that the wire was in the vena cava.      The patient was placed back level and the area for the pocket was anethetized   with local anesthetic.  A 3-cm transverse incision was made with a #15   blade.  Cautery was used to divide the subcutaneous tissues down to the   pectoralis muscle.  An [employer] retractor was used to elevate the skin   while a pocket was created on top of the pectoralis fascia.  The port   was placed into the pocket to confirm that it was of adequate size.  The   catheter was preattached to the port.  The port was then secured to the   pectoralis fascia with four 2-0 Prolene sutures.  These were clamped and   not tied down yet.    The catheter was tunneled through to the wire exit   site.  The catheter was placed along the wire to determine what length it should be to be in the SVC.  The catheter was  cut at 24 cm.  The tunneler sheath and dilator were passed over the wire and the dilator and wire were removed.  The catheter was advanced through the tunneler sheath and the tunneler sheath was pulled away.  Care was taken to keep the catheter in the tunneler sheath as this occurred. This was advanced and the tunneler sheath was removed.  There was good venous   return and easy flush of the catheter.  The Prolene sutures were tied   down to the pectoral fascia.  The skin was reapproximated using 3-0   Vicryl interrupted deep dermal sutures.    Fluoroscopy was used to re-confirm good position of the catheter.  The skin   was then closed using 4-0 Monocryl in a subcuticular fashion.  The port was flushed with concentrated heparin flush as well.  The wounds were then cleaned, dried, and dressed with Dermabond.  The patient was awakened from anesthesia and taken to the PACU in stable condition.  Needle, sponge, and instrument counts were correct.               Stark Klein, MD

## 2018-09-12 NOTE — Transfer of Care (Signed)
Immediate Anesthesia Transfer of Care Note  Patient: Nathaniel Lee  Procedure(s) Performed: INSERTION PORT-A-CATH (Left Chest)  Patient Location: PACU  Anesthesia Type:General  Level of Consciousness: awake, alert  and oriented  Airway & Oxygen Therapy: Patient Spontanous Breathing and Patient connected to nasal cannula oxygen  Post-op Assessment: Report given to RN and Post -op Vital signs reviewed and stable  Post vital signs: Reviewed and stable  Last Vitals:  Vitals Value Taken Time  BP    Temp    Pulse 56 09/12/2018  2:54 PM  Resp 12 09/12/2018  2:54 PM  SpO2 100 % 09/12/2018  2:54 PM  Vitals shown include unvalidated device data.  Last Pain:  Vitals:   09/12/18 1103  TempSrc: Oral  PainSc: 0-No pain         Complications: No apparent anesthesia complications

## 2018-09-12 NOTE — Interval H&P Note (Signed)
History and Physical Interval Note:  09/12/2018 1:46 PM  Nathaniel Lee  has presented today for surgery, with the diagnosis of PANCREATIC CANCER  The various methods of treatment have been discussed with the patient and family. After consideration of risks, benefits and other options for treatment, the patient has consented to  Procedure(s): INSERTION PORT-A-CATH (N/A) as a surgical intervention .  The patient's history has been reviewed, patient examined, no change in status, stable for surgery.  I have reviewed the patient's chart and labs.  Questions were answered to the patient's satisfaction.     Stark Klein

## 2018-09-12 NOTE — Discharge Instructions (Addendum)
[facility] °Office Phone Number [phone] ° ° POST OP INSTRUCTIONS ° °Always review your discharge instruction sheet given to you by the facility where your surgery was performed. ° °IF YOU HAVE DISABILITY OR FAMILY LEAVE FORMS, YOU MUST BRING THEM TO THE OFFICE FOR PROCESSING.  DO NOT GIVE THEM TO YOUR DOCTOR. ° °1. A prescription for pain medication may be given to you upon discharge.  Take your pain medication as prescribed, if needed.  If narcotic pain medicine is not needed, then you may take acetaminophen (Tylenol) or ibuprofen (Advil) as needed. °2. Take your usually prescribed medications unless otherwise directed °3. If you need a refill on your pain medication, please contact your pharmacy.  They will contact our office to request authorization.  Prescriptions will not be filled after 5pm or on week-ends. °4. You should eat very light the first 24 hours after surgery, such as soup, crackers, pudding, etc.  Resume your normal diet the day after surgery °5. It is common to experience some constipation if taking pain medication after surgery.  Increasing fluid intake and taking a stool softener will usually help or prevent this problem from occurring.  A mild laxative (Milk of Magnesia or Miralax) should be taken according to package directions if there are no bowel movements after 48 hours. °6. You may shower in 48 hours.  The surgical glue will flake off in 2-3 weeks.   °7. ACTIVITIES:  No strenuous activity or heavy lifting for 1 week.   °a. You may drive when you no longer are taking prescription pain medication, you can comfortably wear a seatbelt, and you can safely maneuver your car and apply brakes. °b. RETURN TO WORK:  __________to be determined.  _______________ °You should see your doctor in the office for a follow-up appointment approximately three-four weeks after your surgery.   ° °WHEN TO CALL YOUR DOCTOR: °1. Fever over 101.0 °2. Nausea and/or vomiting. °3. Extreme swelling  or bruising. °4. Continued bleeding from incision. °5. Increased pain, redness, or drainage from the incision. ° °The clinic staff is available to answer your questions during regular business hours.  Please don’t hesitate to call and ask to speak to one of the nurses for clinical concerns.  If you have a medical emergency, go to the nearest emergency room or call 911.  A surgeon from [facility] is always on call at the hospital. ° °For further questions, please visit [email or web address]  ° ° ° ° °Post Anesthesia Home Care Instructions ° °Activity: °Get plenty of rest for the remainder of the day. A responsible individual must stay with you for 24 hours following the procedure.  °For the next 24 hours, DO NOT: °-Drive a car °-Operate machinery °-Drink alcoholic beverages °-Take any medication unless instructed by your physician °-Make any legal decisions or sign important papers. ° °Meals: °Start with liquid foods such as gelatin or soup. Progress to regular foods as tolerated. Avoid greasy, spicy, heavy foods. If nausea and/or vomiting occur, drink only clear liquids until the nausea and/or vomiting subsides. Call your physician if vomiting continues. ° °Special Instructions/Symptoms: °Your throat may feel dry or sore from the anesthesia or the breathing tube placed in your throat during surgery. If this causes discomfort, gargle with warm salt water. The discomfort should disappear within 24 hours. ° °If you had a scopolamine patch placed behind your ear for the management of post- operative nausea and/or vomiting: ° °1. The medication in the patch is effective for 72   hours, after which it should be removed.  Wrap patch in a tissue and discard in the trash. Wash hands thoroughly with soap and water. °2. You may remove the patch earlier than 72 hours if you experience unpleasant side effects which may include dry mouth, dizziness or visual disturbances. °3. Avoid touching the patch. Wash your  hands with soap and water after contact with the patch. °  ° °

## 2018-09-13 ENCOUNTER — Encounter (HOSPITAL_BASED_OUTPATIENT_CLINIC_OR_DEPARTMENT_OTHER): Payer: Self-pay | Admitting: General Surgery

## 2018-09-16 ENCOUNTER — Other Ambulatory Visit: Payer: Self-pay | Admitting: Hematology

## 2018-09-26 DIAGNOSIS — C250 Malignant neoplasm of head of pancreas: Secondary | ICD-10-CM | POA: Diagnosis not present

## 2018-09-26 DIAGNOSIS — Z5111 Encounter for antineoplastic chemotherapy: Secondary | ICD-10-CM | POA: Diagnosis not present

## 2018-10-03 DIAGNOSIS — C250 Malignant neoplasm of head of pancreas: Secondary | ICD-10-CM | POA: Diagnosis not present

## 2018-10-03 DIAGNOSIS — Z5111 Encounter for antineoplastic chemotherapy: Secondary | ICD-10-CM | POA: Diagnosis not present

## 2018-10-10 DIAGNOSIS — C250 Malignant neoplasm of head of pancreas: Secondary | ICD-10-CM | POA: Diagnosis not present

## 2018-10-17 DIAGNOSIS — C250 Malignant neoplasm of head of pancreas: Secondary | ICD-10-CM | POA: Diagnosis not present

## 2018-10-17 DIAGNOSIS — F419 Anxiety disorder, unspecified: Secondary | ICD-10-CM | POA: Diagnosis not present

## 2018-10-18 ENCOUNTER — Other Ambulatory Visit: Payer: Self-pay | Admitting: Hematology

## 2018-10-24 DIAGNOSIS — Z5111 Encounter for antineoplastic chemotherapy: Secondary | ICD-10-CM | POA: Diagnosis not present

## 2018-10-24 DIAGNOSIS — C250 Malignant neoplasm of head of pancreas: Secondary | ICD-10-CM | POA: Diagnosis not present

## 2018-10-27 ENCOUNTER — Telehealth: Payer: Self-pay

## 2018-10-27 ENCOUNTER — Other Ambulatory Visit: Payer: Self-pay

## 2018-10-27 NOTE — Telephone Encounter (Signed)
Received fax from [facility] for refill, faxed note to inform them again that patient is receiving treatment at [facility] in [place] not by Dr. Burr Medico.

## 2018-10-31 DIAGNOSIS — R748 Abnormal levels of other serum enzymes: Secondary | ICD-10-CM | POA: Diagnosis not present

## 2018-10-31 DIAGNOSIS — C250 Malignant neoplasm of head of pancreas: Secondary | ICD-10-CM | POA: Diagnosis not present

## 2018-11-07 DIAGNOSIS — C250 Malignant neoplasm of head of pancreas: Secondary | ICD-10-CM | POA: Diagnosis not present

## 2018-11-14 DIAGNOSIS — C250 Malignant neoplasm of head of pancreas: Secondary | ICD-10-CM | POA: Diagnosis not present

## 2018-11-20 DIAGNOSIS — C250 Malignant neoplasm of head of pancreas: Secondary | ICD-10-CM | POA: Diagnosis not present

## 2018-11-20 DIAGNOSIS — Z5111 Encounter for antineoplastic chemotherapy: Secondary | ICD-10-CM | POA: Diagnosis not present

## 2018-11-24 DIAGNOSIS — H25813 Combined forms of age-related cataract, bilateral: Secondary | ICD-10-CM | POA: Diagnosis not present

## 2018-11-24 DIAGNOSIS — H04123 Dry eye syndrome of bilateral lacrimal glands: Secondary | ICD-10-CM | POA: Diagnosis not present

## 2018-11-24 DIAGNOSIS — H40039 Anatomical narrow angle, unspecified eye: Secondary | ICD-10-CM | POA: Diagnosis not present

## 2018-12-11 DIAGNOSIS — I700 Atherosclerosis of aorta: Secondary | ICD-10-CM | POA: Diagnosis not present

## 2018-12-11 DIAGNOSIS — C259 Malignant neoplasm of pancreas, unspecified: Secondary | ICD-10-CM | POA: Diagnosis not present

## 2018-12-11 DIAGNOSIS — N200 Calculus of kidney: Secondary | ICD-10-CM | POA: Diagnosis not present

## 2018-12-11 DIAGNOSIS — C250 Malignant neoplasm of head of pancreas: Secondary | ICD-10-CM | POA: Diagnosis not present

## 2018-12-12 DIAGNOSIS — R978 Other abnormal tumor markers: Secondary | ICD-10-CM | POA: Diagnosis not present

## 2018-12-12 DIAGNOSIS — C250 Malignant neoplasm of head of pancreas: Secondary | ICD-10-CM | POA: Diagnosis not present

## 2018-12-19 DIAGNOSIS — Z5111 Encounter for antineoplastic chemotherapy: Secondary | ICD-10-CM | POA: Diagnosis not present

## 2018-12-19 DIAGNOSIS — C250 Malignant neoplasm of head of pancreas: Secondary | ICD-10-CM | POA: Diagnosis not present

## 2018-12-26 DIAGNOSIS — C250 Malignant neoplasm of head of pancreas: Secondary | ICD-10-CM | POA: Diagnosis not present

## 2019-01-01 IMAGING — CT CT CHEST W/O CM
2 of 4 series · 15 of 36 positions shown, 18 images · non-contrast
Comparison: CT abdomen pelvis 07/29/2018.

CLINICAL DATA: New diagnosis of pancreatic cancer, staging.

EXAM:
CT CHEST WITHOUT CONTRAST
TECHNIQUE: Multidetector CT imaging of the chest was performed following the
standard protocol without IV contrast.

[Series 2: thorax · axial · 0.89mm/px · z∈[+1291,+1567]mm · 12 of 162 slices shown, 15 images]
[im 12/162  mediastinal]
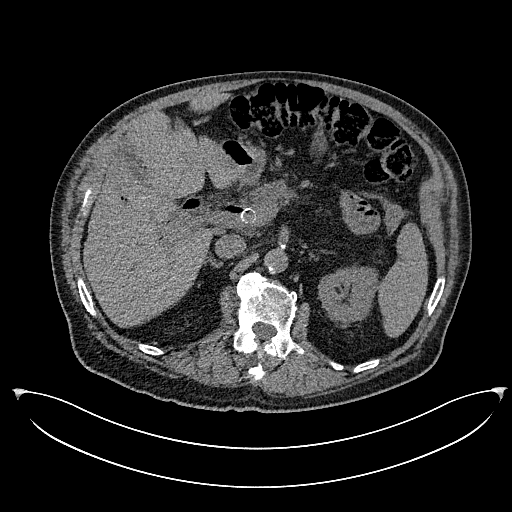
[im 12/162  lung]
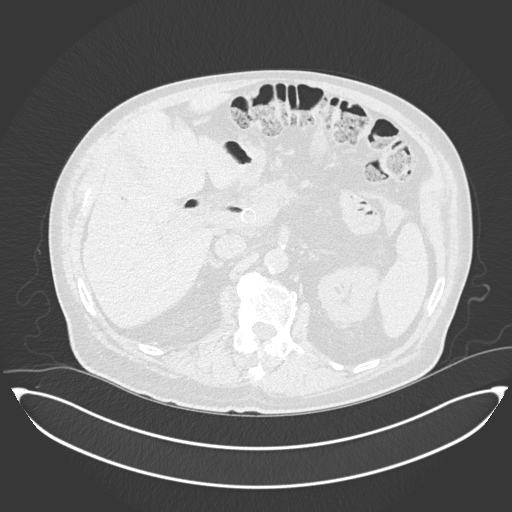
[im 24/162  lung]
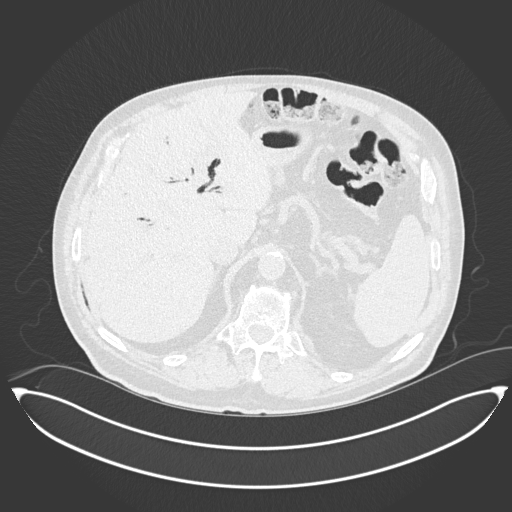
[im 35/162  lung]
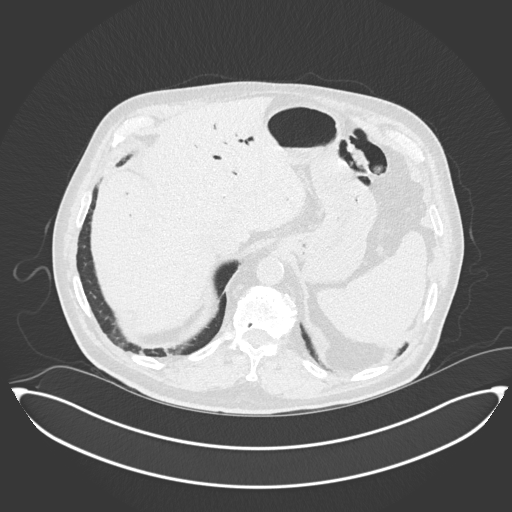
[im 47/162  lung]
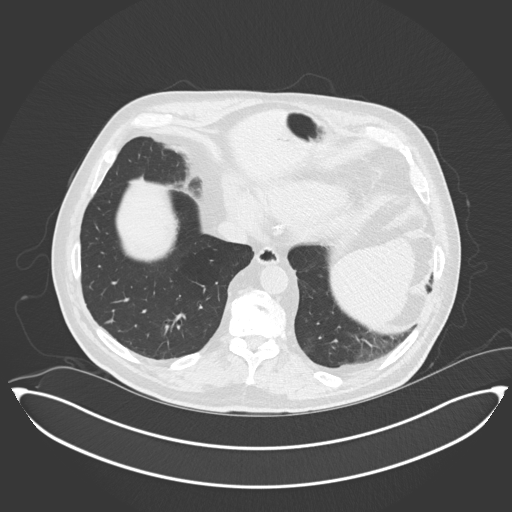
[im 58/162  mediastinal]
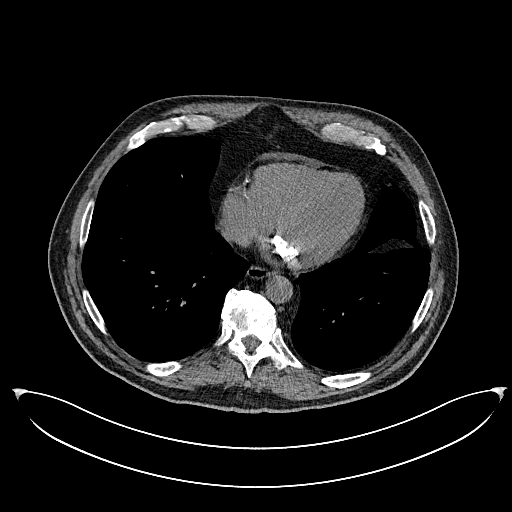
[im 58/162  lung]
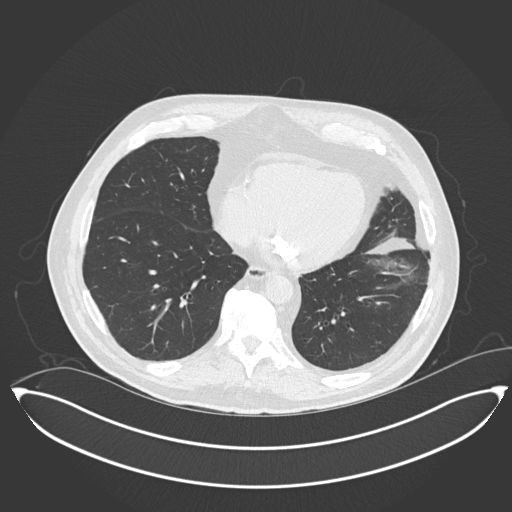
[im 70/162  lung]
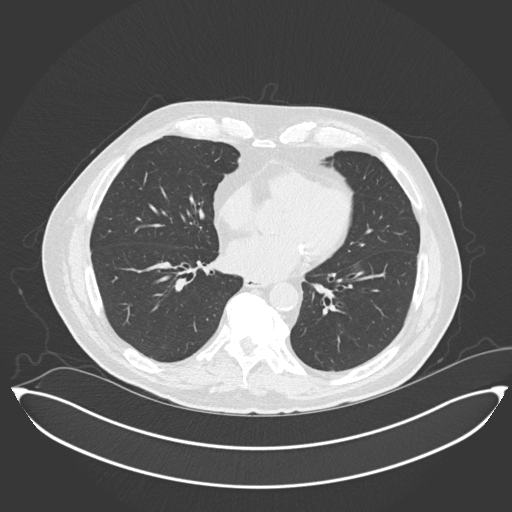
[im 93/162  lung]
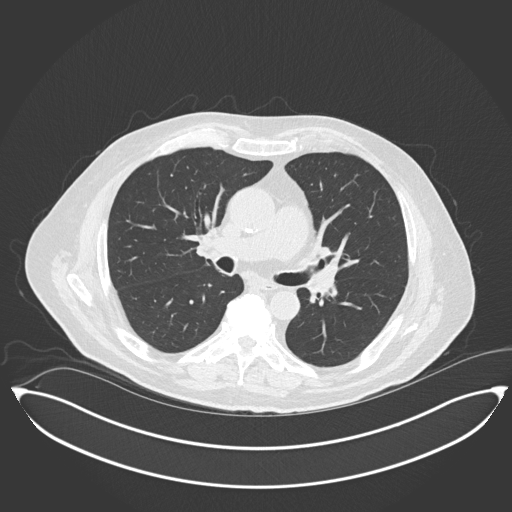
[im 104/162  lung]
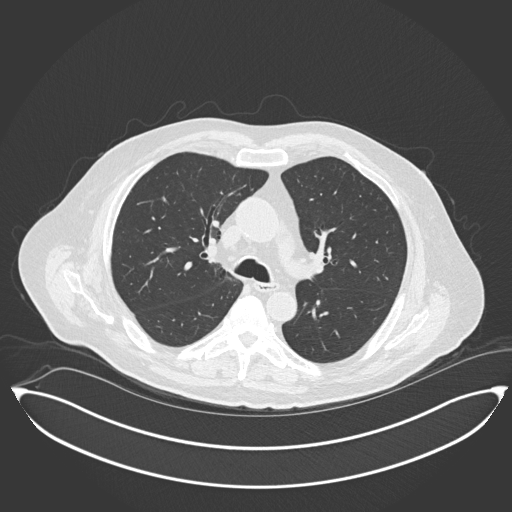
[im 116/162  mediastinal]
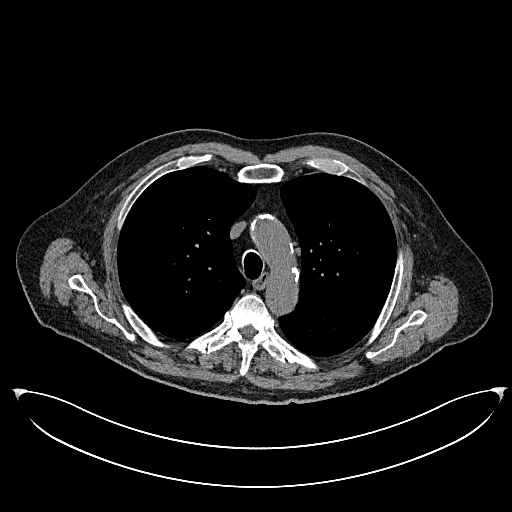
[im 116/162  lung]
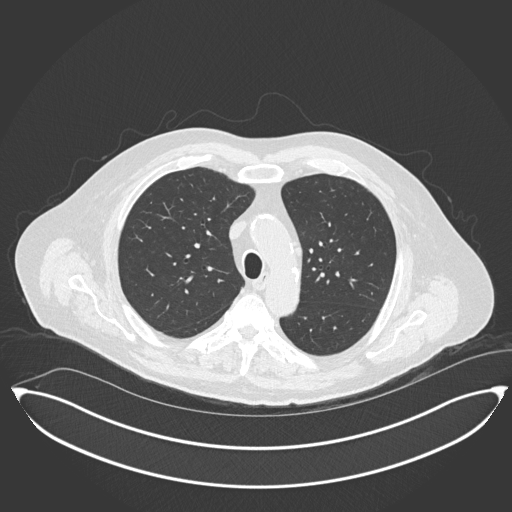
[im 127/162  lung]
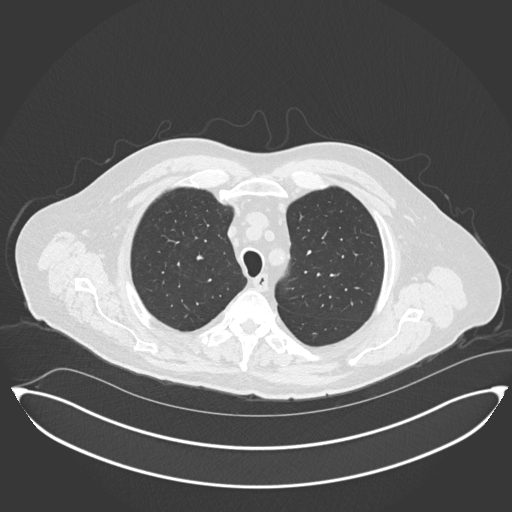
[im 139/162  lung]
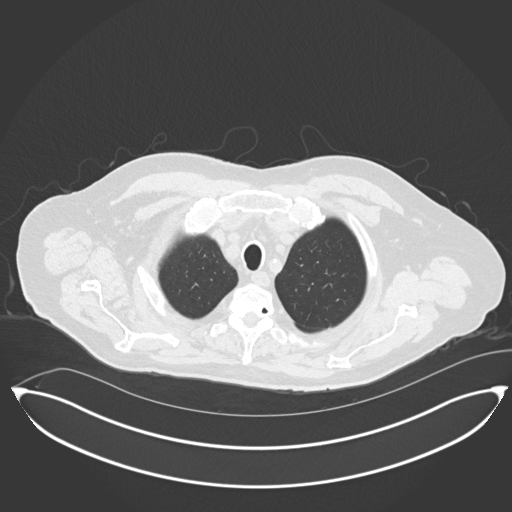
[im 150/162  lung]
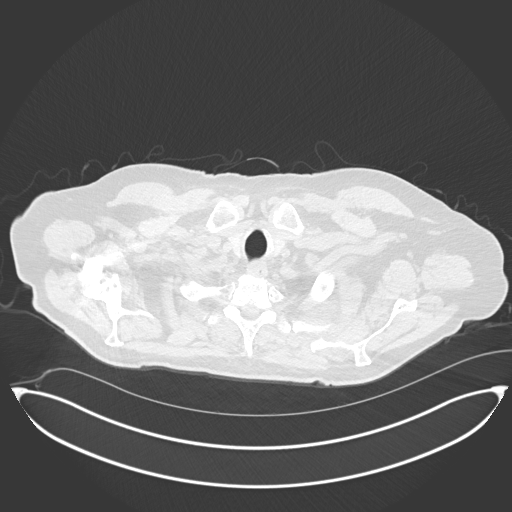

[Series 6: coronal · coronal · 0.65mm/px · 3 of 151 slices shown]
[im 31/151  lung]
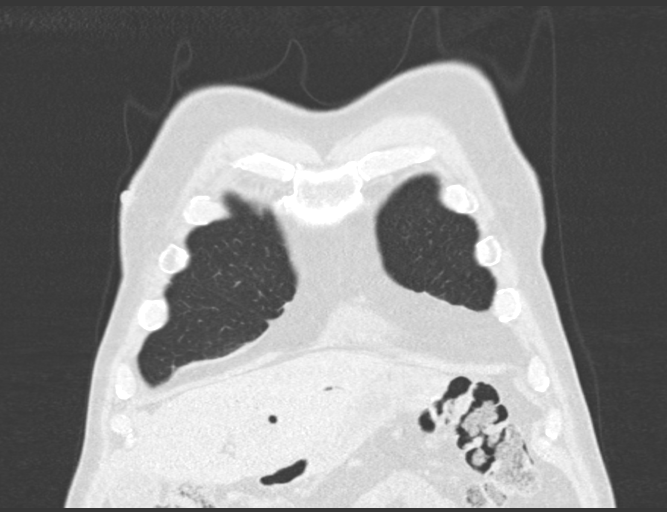
[im 61/151  lung]
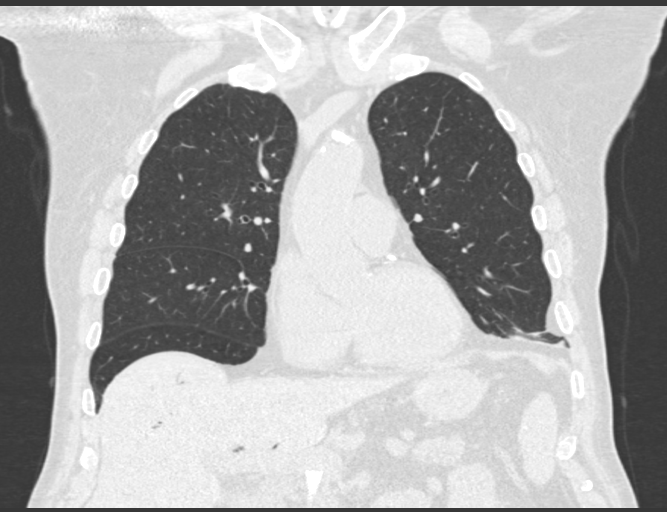
[im 91/151  lung]
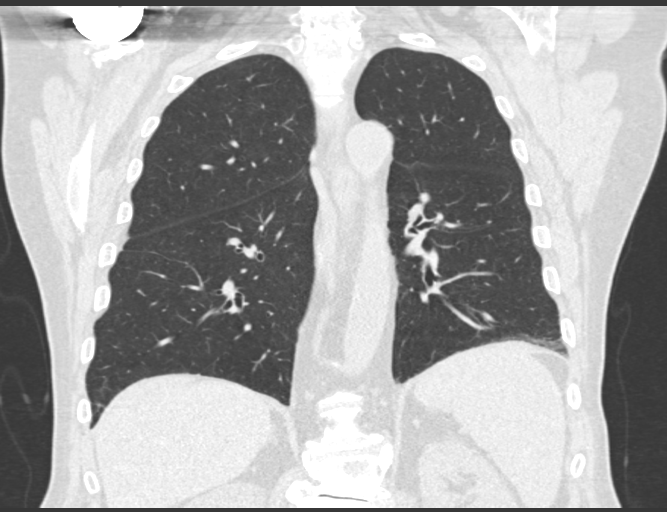

[15 of 36 positions shown; findings below may reference images not displayed]

FINDINGS: Cardiovascular: Atherosclerotic calcification of the arterial
vasculature, including three-vessel involvement of the coronary
arteries. Heart size normal. No pericardial effusion.

Mediastinum/Nodes: Mediastinal lymph nodes are not enlarged by CT
size criteria. Hilar regions are difficult to definitively evaluate
without IV contrast but appear grossly unremarkable. No axillary
adenopathy. Esophagus is grossly unremarkable.

Lungs/Pleura: Mild dependent atelectasis/scarring. Lungs are
otherwise clear. No pleural fluid.

Upper Abdomen: Pneumobilia, related to an indwelling lower common
bile duct stent. Visualized portions of the liver, gallbladder,
adrenal glands, kidneys and spleen are otherwise unremarkable.
Pancreas is incompletely visualized. Peripancreatic lymph node
measures 1.4 cm. Visualized portions of the stomach and bowel are
grossly unremarkable.

Musculoskeletal: Favor irregular lucent cartilage involving the
anterolateral aspects of the tenth ribs bilaterally rather than true
lytic lesions. Degenerative changes in the spine. Right shoulder
arthroplasty. Old rib fractures.
IMPRESSION: 1. No evidence of metastatic disease in the chest.
2. Aortic atherosclerosis ([id number]-170.0). Three-vessel coronary
artery calcification.

## 2019-01-08 ENCOUNTER — Other Ambulatory Visit: Payer: Self-pay | Admitting: Hematology

## 2019-01-09 DIAGNOSIS — C250 Malignant neoplasm of head of pancreas: Secondary | ICD-10-CM | POA: Diagnosis not present

## 2019-01-09 DIAGNOSIS — R978 Other abnormal tumor markers: Secondary | ICD-10-CM | POA: Diagnosis not present

## 2019-01-16 DIAGNOSIS — Z5111 Encounter for antineoplastic chemotherapy: Secondary | ICD-10-CM | POA: Diagnosis not present

## 2019-01-23 DIAGNOSIS — Z5111 Encounter for antineoplastic chemotherapy: Secondary | ICD-10-CM | POA: Diagnosis not present

## 2019-01-23 DIAGNOSIS — C250 Malignant neoplasm of head of pancreas: Secondary | ICD-10-CM | POA: Diagnosis not present

## 2019-01-25 DIAGNOSIS — Z8507 Personal history of malignant neoplasm of pancreas: Secondary | ICD-10-CM | POA: Diagnosis not present

## 2019-01-25 DIAGNOSIS — E7800 Pure hypercholesterolemia, unspecified: Secondary | ICD-10-CM | POA: Diagnosis not present

## 2019-01-25 DIAGNOSIS — I10 Essential (primary) hypertension: Secondary | ICD-10-CM | POA: Diagnosis not present

## 2019-01-25 DIAGNOSIS — Z6830 Body mass index (BMI) 30.0-30.9, adult: Secondary | ICD-10-CM | POA: Diagnosis not present

## 2019-01-30 ENCOUNTER — Other Ambulatory Visit: Payer: Self-pay | Admitting: Hematology

## 2019-02-13 DIAGNOSIS — C250 Malignant neoplasm of head of pancreas: Secondary | ICD-10-CM | POA: Diagnosis not present

## 2019-02-20 DIAGNOSIS — C250 Malignant neoplasm of head of pancreas: Secondary | ICD-10-CM | POA: Diagnosis not present

## 2019-02-20 DIAGNOSIS — Z5111 Encounter for antineoplastic chemotherapy: Secondary | ICD-10-CM | POA: Diagnosis not present

## 2019-02-27 DIAGNOSIS — Z5111 Encounter for antineoplastic chemotherapy: Secondary | ICD-10-CM | POA: Diagnosis not present

## 2019-02-27 DIAGNOSIS — C250 Malignant neoplasm of head of pancreas: Secondary | ICD-10-CM | POA: Diagnosis not present

## 2019-03-08 DIAGNOSIS — C250 Malignant neoplasm of head of pancreas: Secondary | ICD-10-CM | POA: Diagnosis not present

## 2019-03-08 DIAGNOSIS — N200 Calculus of kidney: Secondary | ICD-10-CM | POA: Diagnosis not present

## 2019-03-08 DIAGNOSIS — R590 Localized enlarged lymph nodes: Secondary | ICD-10-CM | POA: Diagnosis not present

## 2019-03-08 DIAGNOSIS — I700 Atherosclerosis of aorta: Secondary | ICD-10-CM | POA: Diagnosis not present

## 2019-03-13 DIAGNOSIS — C250 Malignant neoplasm of head of pancreas: Secondary | ICD-10-CM | POA: Diagnosis not present

## 2019-03-20 DIAGNOSIS — Z5111 Encounter for antineoplastic chemotherapy: Secondary | ICD-10-CM | POA: Diagnosis not present

## 2019-03-20 DIAGNOSIS — C250 Malignant neoplasm of head of pancreas: Secondary | ICD-10-CM | POA: Diagnosis not present

## 2019-03-27 DIAGNOSIS — Z5111 Encounter for antineoplastic chemotherapy: Secondary | ICD-10-CM | POA: Diagnosis not present

## 2019-03-27 DIAGNOSIS — C250 Malignant neoplasm of head of pancreas: Secondary | ICD-10-CM | POA: Diagnosis not present

## 2019-04-10 DIAGNOSIS — C250 Malignant neoplasm of head of pancreas: Secondary | ICD-10-CM | POA: Diagnosis not present

## 2019-04-10 DIAGNOSIS — R978 Other abnormal tumor markers: Secondary | ICD-10-CM | POA: Diagnosis not present

## 2019-04-17 DIAGNOSIS — C250 Malignant neoplasm of head of pancreas: Secondary | ICD-10-CM | POA: Diagnosis not present

## 2019-04-17 DIAGNOSIS — Z5111 Encounter for antineoplastic chemotherapy: Secondary | ICD-10-CM | POA: Diagnosis not present

## 2019-04-24 DIAGNOSIS — Z5111 Encounter for antineoplastic chemotherapy: Secondary | ICD-10-CM | POA: Diagnosis not present

## 2019-04-24 DIAGNOSIS — C250 Malignant neoplasm of head of pancreas: Secondary | ICD-10-CM | POA: Diagnosis not present

## 2019-05-08 DIAGNOSIS — C250 Malignant neoplasm of head of pancreas: Secondary | ICD-10-CM | POA: Diagnosis not present

## 2019-05-15 DIAGNOSIS — Z5111 Encounter for antineoplastic chemotherapy: Secondary | ICD-10-CM | POA: Diagnosis not present

## 2019-05-15 DIAGNOSIS — C250 Malignant neoplasm of head of pancreas: Secondary | ICD-10-CM | POA: Diagnosis not present

## 2019-05-22 DIAGNOSIS — R978 Other abnormal tumor markers: Secondary | ICD-10-CM | POA: Diagnosis not present

## 2019-05-22 DIAGNOSIS — C250 Malignant neoplasm of head of pancreas: Secondary | ICD-10-CM | POA: Diagnosis not present

## 2019-05-23 DIAGNOSIS — C250 Malignant neoplasm of head of pancreas: Secondary | ICD-10-CM

## 2019-05-28 DIAGNOSIS — C250 Malignant neoplasm of head of pancreas: Secondary | ICD-10-CM | POA: Diagnosis not present

## 2019-05-28 DIAGNOSIS — D649 Anemia, unspecified: Secondary | ICD-10-CM | POA: Diagnosis not present

## 2019-05-28 DIAGNOSIS — E778 Other disorders of glycoprotein metabolism: Secondary | ICD-10-CM | POA: Diagnosis not present

## 2019-05-28 DIAGNOSIS — R600 Localized edema: Secondary | ICD-10-CM | POA: Diagnosis not present

## 2019-05-28 DIAGNOSIS — L03115 Cellulitis of right lower limb: Secondary | ICD-10-CM | POA: Diagnosis not present

## 2019-05-29 DIAGNOSIS — C250 Malignant neoplasm of head of pancreas: Secondary | ICD-10-CM | POA: Diagnosis not present

## 2019-05-29 DIAGNOSIS — C259 Malignant neoplasm of pancreas, unspecified: Secondary | ICD-10-CM | POA: Diagnosis not present

## 2019-05-29 DIAGNOSIS — S0990XA Unspecified injury of head, initial encounter: Secondary | ICD-10-CM | POA: Diagnosis not present

## 2019-05-29 DIAGNOSIS — R296 Repeated falls: Secondary | ICD-10-CM | POA: Diagnosis not present

## 2019-05-30 DIAGNOSIS — C250 Malignant neoplasm of head of pancreas: Secondary | ICD-10-CM | POA: Diagnosis not present

## 2019-06-11 DIAGNOSIS — D649 Anemia, unspecified: Secondary | ICD-10-CM | POA: Diagnosis not present

## 2019-06-11 DIAGNOSIS — Z515 Encounter for palliative care: Secondary | ICD-10-CM | POA: Diagnosis not present

## 2019-06-11 DIAGNOSIS — R600 Localized edema: Secondary | ICD-10-CM | POA: Diagnosis not present

## 2019-06-11 DIAGNOSIS — C250 Malignant neoplasm of head of pancreas: Secondary | ICD-10-CM | POA: Diagnosis not present

## 2019-06-12 DIAGNOSIS — I10 Essential (primary) hypertension: Secondary | ICD-10-CM | POA: Diagnosis not present

## 2019-06-12 DIAGNOSIS — Z87891 Personal history of nicotine dependence: Secondary | ICD-10-CM | POA: Diagnosis not present

## 2019-06-12 DIAGNOSIS — C250 Malignant neoplasm of head of pancreas: Secondary | ICD-10-CM | POA: Diagnosis not present

## 2019-06-12 DIAGNOSIS — E785 Hyperlipidemia, unspecified: Secondary | ICD-10-CM | POA: Diagnosis not present

## 2019-06-12 DIAGNOSIS — G4733 Obstructive sleep apnea (adult) (pediatric): Secondary | ICD-10-CM | POA: Diagnosis not present

## 2019-06-12 DIAGNOSIS — J449 Chronic obstructive pulmonary disease, unspecified: Secondary | ICD-10-CM | POA: Diagnosis not present

## 2019-06-12 DIAGNOSIS — K219 Gastro-esophageal reflux disease without esophagitis: Secondary | ICD-10-CM | POA: Diagnosis not present

## 2019-06-12 DIAGNOSIS — F419 Anxiety disorder, unspecified: Secondary | ICD-10-CM | POA: Diagnosis not present

## 2019-06-13 DIAGNOSIS — J449 Chronic obstructive pulmonary disease, unspecified: Secondary | ICD-10-CM | POA: Diagnosis not present

## 2019-06-13 DIAGNOSIS — C250 Malignant neoplasm of head of pancreas: Secondary | ICD-10-CM | POA: Diagnosis not present

## 2019-06-13 DIAGNOSIS — I10 Essential (primary) hypertension: Secondary | ICD-10-CM | POA: Diagnosis not present

## 2019-06-13 DIAGNOSIS — G4733 Obstructive sleep apnea (adult) (pediatric): Secondary | ICD-10-CM | POA: Diagnosis not present

## 2019-06-13 DIAGNOSIS — F419 Anxiety disorder, unspecified: Secondary | ICD-10-CM | POA: Diagnosis not present

## 2019-06-13 DIAGNOSIS — Z87891 Personal history of nicotine dependence: Secondary | ICD-10-CM | POA: Diagnosis not present

## 2019-06-19 DIAGNOSIS — J449 Chronic obstructive pulmonary disease, unspecified: Secondary | ICD-10-CM | POA: Diagnosis not present

## 2019-06-19 DIAGNOSIS — I10 Essential (primary) hypertension: Secondary | ICD-10-CM | POA: Diagnosis not present

## 2019-06-19 DIAGNOSIS — Z87891 Personal history of nicotine dependence: Secondary | ICD-10-CM | POA: Diagnosis not present

## 2019-06-19 DIAGNOSIS — G4733 Obstructive sleep apnea (adult) (pediatric): Secondary | ICD-10-CM | POA: Diagnosis not present

## 2019-06-19 DIAGNOSIS — C250 Malignant neoplasm of head of pancreas: Secondary | ICD-10-CM | POA: Diagnosis not present

## 2019-06-19 DIAGNOSIS — F419 Anxiety disorder, unspecified: Secondary | ICD-10-CM | POA: Diagnosis not present

## 2019-06-27 DIAGNOSIS — J449 Chronic obstructive pulmonary disease, unspecified: Secondary | ICD-10-CM | POA: Diagnosis not present

## 2019-06-27 DIAGNOSIS — C250 Malignant neoplasm of head of pancreas: Secondary | ICD-10-CM | POA: Diagnosis not present

## 2019-06-27 DIAGNOSIS — Z87891 Personal history of nicotine dependence: Secondary | ICD-10-CM | POA: Diagnosis not present

## 2019-06-27 DIAGNOSIS — F419 Anxiety disorder, unspecified: Secondary | ICD-10-CM | POA: Diagnosis not present

## 2019-06-27 DIAGNOSIS — G4733 Obstructive sleep apnea (adult) (pediatric): Secondary | ICD-10-CM | POA: Diagnosis not present

## 2019-06-27 DIAGNOSIS — I10 Essential (primary) hypertension: Secondary | ICD-10-CM | POA: Diagnosis not present

## 2019-06-30 DIAGNOSIS — E785 Hyperlipidemia, unspecified: Secondary | ICD-10-CM | POA: Diagnosis not present

## 2019-06-30 DIAGNOSIS — Z87891 Personal history of nicotine dependence: Secondary | ICD-10-CM | POA: Diagnosis not present

## 2019-06-30 DIAGNOSIS — F419 Anxiety disorder, unspecified: Secondary | ICD-10-CM | POA: Diagnosis not present

## 2019-06-30 DIAGNOSIS — K219 Gastro-esophageal reflux disease without esophagitis: Secondary | ICD-10-CM | POA: Diagnosis not present

## 2019-06-30 DIAGNOSIS — J449 Chronic obstructive pulmonary disease, unspecified: Secondary | ICD-10-CM | POA: Diagnosis not present

## 2019-06-30 DIAGNOSIS — G4733 Obstructive sleep apnea (adult) (pediatric): Secondary | ICD-10-CM | POA: Diagnosis not present

## 2019-06-30 DIAGNOSIS — C250 Malignant neoplasm of head of pancreas: Secondary | ICD-10-CM | POA: Diagnosis not present

## 2019-06-30 DIAGNOSIS — I10 Essential (primary) hypertension: Secondary | ICD-10-CM | POA: Diagnosis not present

## 2019-07-03 DIAGNOSIS — I10 Essential (primary) hypertension: Secondary | ICD-10-CM | POA: Diagnosis not present

## 2019-07-03 DIAGNOSIS — Z87891 Personal history of nicotine dependence: Secondary | ICD-10-CM | POA: Diagnosis not present

## 2019-07-03 DIAGNOSIS — C250 Malignant neoplasm of head of pancreas: Secondary | ICD-10-CM | POA: Diagnosis not present

## 2019-07-03 DIAGNOSIS — J449 Chronic obstructive pulmonary disease, unspecified: Secondary | ICD-10-CM | POA: Diagnosis not present

## 2019-07-03 DIAGNOSIS — F419 Anxiety disorder, unspecified: Secondary | ICD-10-CM | POA: Diagnosis not present

## 2019-07-03 DIAGNOSIS — G4733 Obstructive sleep apnea (adult) (pediatric): Secondary | ICD-10-CM | POA: Diagnosis not present

## 2019-07-11 DIAGNOSIS — F419 Anxiety disorder, unspecified: Secondary | ICD-10-CM | POA: Diagnosis not present

## 2019-07-11 DIAGNOSIS — C250 Malignant neoplasm of head of pancreas: Secondary | ICD-10-CM | POA: Diagnosis not present

## 2019-07-11 DIAGNOSIS — I10 Essential (primary) hypertension: Secondary | ICD-10-CM | POA: Diagnosis not present

## 2019-07-11 DIAGNOSIS — Z87891 Personal history of nicotine dependence: Secondary | ICD-10-CM | POA: Diagnosis not present

## 2019-07-11 DIAGNOSIS — J449 Chronic obstructive pulmonary disease, unspecified: Secondary | ICD-10-CM | POA: Diagnosis not present

## 2019-07-11 DIAGNOSIS — G4733 Obstructive sleep apnea (adult) (pediatric): Secondary | ICD-10-CM | POA: Diagnosis not present

## 2019-07-16 DIAGNOSIS — G4733 Obstructive sleep apnea (adult) (pediatric): Secondary | ICD-10-CM | POA: Diagnosis not present

## 2019-07-16 DIAGNOSIS — Z87891 Personal history of nicotine dependence: Secondary | ICD-10-CM | POA: Diagnosis not present

## 2019-07-16 DIAGNOSIS — C250 Malignant neoplasm of head of pancreas: Secondary | ICD-10-CM | POA: Diagnosis not present

## 2019-07-16 DIAGNOSIS — I10 Essential (primary) hypertension: Secondary | ICD-10-CM | POA: Diagnosis not present

## 2019-07-16 DIAGNOSIS — F419 Anxiety disorder, unspecified: Secondary | ICD-10-CM | POA: Diagnosis not present

## 2019-07-16 DIAGNOSIS — J449 Chronic obstructive pulmonary disease, unspecified: Secondary | ICD-10-CM | POA: Diagnosis not present

## 2019-07-18 DIAGNOSIS — J449 Chronic obstructive pulmonary disease, unspecified: Secondary | ICD-10-CM | POA: Diagnosis not present

## 2019-07-18 DIAGNOSIS — C250 Malignant neoplasm of head of pancreas: Secondary | ICD-10-CM | POA: Diagnosis not present

## 2019-07-18 DIAGNOSIS — Z87891 Personal history of nicotine dependence: Secondary | ICD-10-CM | POA: Diagnosis not present

## 2019-07-18 DIAGNOSIS — I10 Essential (primary) hypertension: Secondary | ICD-10-CM | POA: Diagnosis not present

## 2019-07-18 DIAGNOSIS — G4733 Obstructive sleep apnea (adult) (pediatric): Secondary | ICD-10-CM | POA: Diagnosis not present

## 2019-07-18 DIAGNOSIS — F419 Anxiety disorder, unspecified: Secondary | ICD-10-CM | POA: Diagnosis not present

## 2019-07-19 DIAGNOSIS — F419 Anxiety disorder, unspecified: Secondary | ICD-10-CM | POA: Diagnosis not present

## 2019-07-19 DIAGNOSIS — C250 Malignant neoplasm of head of pancreas: Secondary | ICD-10-CM | POA: Diagnosis not present

## 2019-07-19 DIAGNOSIS — Z87891 Personal history of nicotine dependence: Secondary | ICD-10-CM | POA: Diagnosis not present

## 2019-07-19 DIAGNOSIS — J449 Chronic obstructive pulmonary disease, unspecified: Secondary | ICD-10-CM | POA: Diagnosis not present

## 2019-07-19 DIAGNOSIS — G4733 Obstructive sleep apnea (adult) (pediatric): Secondary | ICD-10-CM | POA: Diagnosis not present

## 2019-07-19 DIAGNOSIS — I10 Essential (primary) hypertension: Secondary | ICD-10-CM | POA: Diagnosis not present

## 2019-07-23 DIAGNOSIS — G4733 Obstructive sleep apnea (adult) (pediatric): Secondary | ICD-10-CM | POA: Diagnosis not present

## 2019-07-23 DIAGNOSIS — C250 Malignant neoplasm of head of pancreas: Secondary | ICD-10-CM | POA: Diagnosis not present

## 2019-07-23 DIAGNOSIS — I10 Essential (primary) hypertension: Secondary | ICD-10-CM | POA: Diagnosis not present

## 2019-07-23 DIAGNOSIS — J449 Chronic obstructive pulmonary disease, unspecified: Secondary | ICD-10-CM | POA: Diagnosis not present

## 2019-07-23 DIAGNOSIS — F419 Anxiety disorder, unspecified: Secondary | ICD-10-CM | POA: Diagnosis not present

## 2019-07-23 DIAGNOSIS — Z87891 Personal history of nicotine dependence: Secondary | ICD-10-CM | POA: Diagnosis not present

## 2019-07-24 DIAGNOSIS — C250 Malignant neoplasm of head of pancreas: Secondary | ICD-10-CM | POA: Diagnosis not present

## 2019-07-24 DIAGNOSIS — F419 Anxiety disorder, unspecified: Secondary | ICD-10-CM | POA: Diagnosis not present

## 2019-07-24 DIAGNOSIS — I10 Essential (primary) hypertension: Secondary | ICD-10-CM | POA: Diagnosis not present

## 2019-07-24 DIAGNOSIS — Z87891 Personal history of nicotine dependence: Secondary | ICD-10-CM | POA: Diagnosis not present

## 2019-07-24 DIAGNOSIS — J449 Chronic obstructive pulmonary disease, unspecified: Secondary | ICD-10-CM | POA: Diagnosis not present

## 2019-07-24 DIAGNOSIS — G4733 Obstructive sleep apnea (adult) (pediatric): Secondary | ICD-10-CM | POA: Diagnosis not present

## 2019-07-27 DIAGNOSIS — J449 Chronic obstructive pulmonary disease, unspecified: Secondary | ICD-10-CM | POA: Diagnosis not present

## 2019-07-27 DIAGNOSIS — Z87891 Personal history of nicotine dependence: Secondary | ICD-10-CM | POA: Diagnosis not present

## 2019-07-27 DIAGNOSIS — C250 Malignant neoplasm of head of pancreas: Secondary | ICD-10-CM | POA: Diagnosis not present

## 2019-07-27 DIAGNOSIS — F419 Anxiety disorder, unspecified: Secondary | ICD-10-CM | POA: Diagnosis not present

## 2019-07-27 DIAGNOSIS — I10 Essential (primary) hypertension: Secondary | ICD-10-CM | POA: Diagnosis not present

## 2019-07-27 DIAGNOSIS — G4733 Obstructive sleep apnea (adult) (pediatric): Secondary | ICD-10-CM | POA: Diagnosis not present

## 2019-07-31 DIAGNOSIS — J449 Chronic obstructive pulmonary disease, unspecified: Secondary | ICD-10-CM | POA: Diagnosis not present

## 2019-07-31 DIAGNOSIS — C250 Malignant neoplasm of head of pancreas: Secondary | ICD-10-CM | POA: Diagnosis not present

## 2019-07-31 DIAGNOSIS — Z87891 Personal history of nicotine dependence: Secondary | ICD-10-CM | POA: Diagnosis not present

## 2019-07-31 DIAGNOSIS — K219 Gastro-esophageal reflux disease without esophagitis: Secondary | ICD-10-CM | POA: Diagnosis not present

## 2019-07-31 DIAGNOSIS — I10 Essential (primary) hypertension: Secondary | ICD-10-CM | POA: Diagnosis not present

## 2019-07-31 DIAGNOSIS — G4733 Obstructive sleep apnea (adult) (pediatric): Secondary | ICD-10-CM | POA: Diagnosis not present

## 2019-07-31 DIAGNOSIS — E785 Hyperlipidemia, unspecified: Secondary | ICD-10-CM | POA: Diagnosis not present

## 2019-07-31 DIAGNOSIS — F419 Anxiety disorder, unspecified: Secondary | ICD-10-CM | POA: Diagnosis not present

## 2019-08-02 DIAGNOSIS — G4733 Obstructive sleep apnea (adult) (pediatric): Secondary | ICD-10-CM | POA: Diagnosis not present

## 2019-08-02 DIAGNOSIS — I10 Essential (primary) hypertension: Secondary | ICD-10-CM | POA: Diagnosis not present

## 2019-08-02 DIAGNOSIS — F419 Anxiety disorder, unspecified: Secondary | ICD-10-CM | POA: Diagnosis not present

## 2019-08-02 DIAGNOSIS — J449 Chronic obstructive pulmonary disease, unspecified: Secondary | ICD-10-CM | POA: Diagnosis not present

## 2019-08-02 DIAGNOSIS — C250 Malignant neoplasm of head of pancreas: Secondary | ICD-10-CM | POA: Diagnosis not present

## 2019-08-02 DIAGNOSIS — Z87891 Personal history of nicotine dependence: Secondary | ICD-10-CM | POA: Diagnosis not present

## 2019-08-15 DIAGNOSIS — C250 Malignant neoplasm of head of pancreas: Secondary | ICD-10-CM | POA: Diagnosis not present

## 2019-08-15 DIAGNOSIS — F419 Anxiety disorder, unspecified: Secondary | ICD-10-CM | POA: Diagnosis not present

## 2019-08-15 DIAGNOSIS — I10 Essential (primary) hypertension: Secondary | ICD-10-CM | POA: Diagnosis not present

## 2019-08-15 DIAGNOSIS — J449 Chronic obstructive pulmonary disease, unspecified: Secondary | ICD-10-CM | POA: Diagnosis not present

## 2019-08-15 DIAGNOSIS — Z87891 Personal history of nicotine dependence: Secondary | ICD-10-CM | POA: Diagnosis not present

## 2019-08-15 DIAGNOSIS — G4733 Obstructive sleep apnea (adult) (pediatric): Secondary | ICD-10-CM | POA: Diagnosis not present

## 2019-08-21 DIAGNOSIS — I10 Essential (primary) hypertension: Secondary | ICD-10-CM | POA: Diagnosis not present

## 2019-08-21 DIAGNOSIS — C250 Malignant neoplasm of head of pancreas: Secondary | ICD-10-CM | POA: Diagnosis not present

## 2019-08-21 DIAGNOSIS — Z87891 Personal history of nicotine dependence: Secondary | ICD-10-CM | POA: Diagnosis not present

## 2019-08-21 DIAGNOSIS — F419 Anxiety disorder, unspecified: Secondary | ICD-10-CM | POA: Diagnosis not present

## 2019-08-21 DIAGNOSIS — J449 Chronic obstructive pulmonary disease, unspecified: Secondary | ICD-10-CM | POA: Diagnosis not present

## 2019-08-21 DIAGNOSIS — G4733 Obstructive sleep apnea (adult) (pediatric): Secondary | ICD-10-CM | POA: Diagnosis not present

## 2019-08-25 DIAGNOSIS — C250 Malignant neoplasm of head of pancreas: Secondary | ICD-10-CM | POA: Diagnosis not present

## 2019-08-25 DIAGNOSIS — I10 Essential (primary) hypertension: Secondary | ICD-10-CM | POA: Diagnosis not present

## 2019-08-25 DIAGNOSIS — G4733 Obstructive sleep apnea (adult) (pediatric): Secondary | ICD-10-CM | POA: Diagnosis not present

## 2019-08-25 DIAGNOSIS — Z87891 Personal history of nicotine dependence: Secondary | ICD-10-CM | POA: Diagnosis not present

## 2019-08-25 DIAGNOSIS — F419 Anxiety disorder, unspecified: Secondary | ICD-10-CM | POA: Diagnosis not present

## 2019-08-25 DIAGNOSIS — J449 Chronic obstructive pulmonary disease, unspecified: Secondary | ICD-10-CM | POA: Diagnosis not present

## 2019-08-29 DIAGNOSIS — J449 Chronic obstructive pulmonary disease, unspecified: Secondary | ICD-10-CM | POA: Diagnosis not present

## 2019-08-29 DIAGNOSIS — I10 Essential (primary) hypertension: Secondary | ICD-10-CM | POA: Diagnosis not present

## 2019-08-29 DIAGNOSIS — Z87891 Personal history of nicotine dependence: Secondary | ICD-10-CM | POA: Diagnosis not present

## 2019-08-29 DIAGNOSIS — F419 Anxiety disorder, unspecified: Secondary | ICD-10-CM | POA: Diagnosis not present

## 2019-08-29 DIAGNOSIS — C250 Malignant neoplasm of head of pancreas: Secondary | ICD-10-CM | POA: Diagnosis not present

## 2019-08-29 DIAGNOSIS — G4733 Obstructive sleep apnea (adult) (pediatric): Secondary | ICD-10-CM | POA: Diagnosis not present

## 2019-08-30 DIAGNOSIS — F419 Anxiety disorder, unspecified: Secondary | ICD-10-CM | POA: Diagnosis not present

## 2019-08-30 DIAGNOSIS — I10 Essential (primary) hypertension: Secondary | ICD-10-CM | POA: Diagnosis not present

## 2019-08-30 DIAGNOSIS — Z87891 Personal history of nicotine dependence: Secondary | ICD-10-CM | POA: Diagnosis not present

## 2019-08-30 DIAGNOSIS — K219 Gastro-esophageal reflux disease without esophagitis: Secondary | ICD-10-CM | POA: Diagnosis not present

## 2019-08-30 DIAGNOSIS — G4733 Obstructive sleep apnea (adult) (pediatric): Secondary | ICD-10-CM | POA: Diagnosis not present

## 2019-08-30 DIAGNOSIS — E785 Hyperlipidemia, unspecified: Secondary | ICD-10-CM | POA: Diagnosis not present

## 2019-08-30 DIAGNOSIS — J449 Chronic obstructive pulmonary disease, unspecified: Secondary | ICD-10-CM | POA: Diagnosis not present

## 2019-08-30 DIAGNOSIS — C250 Malignant neoplasm of head of pancreas: Secondary | ICD-10-CM | POA: Diagnosis not present

## 2019-08-31 DIAGNOSIS — Z87891 Personal history of nicotine dependence: Secondary | ICD-10-CM | POA: Diagnosis not present

## 2019-08-31 DIAGNOSIS — J449 Chronic obstructive pulmonary disease, unspecified: Secondary | ICD-10-CM | POA: Diagnosis not present

## 2019-08-31 DIAGNOSIS — C250 Malignant neoplasm of head of pancreas: Secondary | ICD-10-CM | POA: Diagnosis not present

## 2019-08-31 DIAGNOSIS — I10 Essential (primary) hypertension: Secondary | ICD-10-CM | POA: Diagnosis not present

## 2019-08-31 DIAGNOSIS — G4733 Obstructive sleep apnea (adult) (pediatric): Secondary | ICD-10-CM | POA: Diagnosis not present

## 2019-08-31 DIAGNOSIS — F419 Anxiety disorder, unspecified: Secondary | ICD-10-CM | POA: Diagnosis not present

## 2019-09-04 DIAGNOSIS — C250 Malignant neoplasm of head of pancreas: Secondary | ICD-10-CM | POA: Diagnosis not present

## 2019-09-04 DIAGNOSIS — I10 Essential (primary) hypertension: Secondary | ICD-10-CM | POA: Diagnosis not present

## 2019-09-04 DIAGNOSIS — J449 Chronic obstructive pulmonary disease, unspecified: Secondary | ICD-10-CM | POA: Diagnosis not present

## 2019-09-04 DIAGNOSIS — F419 Anxiety disorder, unspecified: Secondary | ICD-10-CM | POA: Diagnosis not present

## 2019-09-04 DIAGNOSIS — Z87891 Personal history of nicotine dependence: Secondary | ICD-10-CM | POA: Diagnosis not present

## 2019-09-04 DIAGNOSIS — G4733 Obstructive sleep apnea (adult) (pediatric): Secondary | ICD-10-CM | POA: Diagnosis not present

## 2019-09-06 DIAGNOSIS — G4733 Obstructive sleep apnea (adult) (pediatric): Secondary | ICD-10-CM | POA: Diagnosis not present

## 2019-09-06 DIAGNOSIS — J449 Chronic obstructive pulmonary disease, unspecified: Secondary | ICD-10-CM | POA: Diagnosis not present

## 2019-09-06 DIAGNOSIS — I10 Essential (primary) hypertension: Secondary | ICD-10-CM | POA: Diagnosis not present

## 2019-09-06 DIAGNOSIS — F419 Anxiety disorder, unspecified: Secondary | ICD-10-CM | POA: Diagnosis not present

## 2019-09-06 DIAGNOSIS — C250 Malignant neoplasm of head of pancreas: Secondary | ICD-10-CM | POA: Diagnosis not present

## 2019-09-06 DIAGNOSIS — Z87891 Personal history of nicotine dependence: Secondary | ICD-10-CM | POA: Diagnosis not present

## 2019-09-12 DIAGNOSIS — Z87891 Personal history of nicotine dependence: Secondary | ICD-10-CM | POA: Diagnosis not present

## 2019-09-12 DIAGNOSIS — I10 Essential (primary) hypertension: Secondary | ICD-10-CM | POA: Diagnosis not present

## 2019-09-12 DIAGNOSIS — F419 Anxiety disorder, unspecified: Secondary | ICD-10-CM | POA: Diagnosis not present

## 2019-09-12 DIAGNOSIS — C250 Malignant neoplasm of head of pancreas: Secondary | ICD-10-CM | POA: Diagnosis not present

## 2019-09-12 DIAGNOSIS — J449 Chronic obstructive pulmonary disease, unspecified: Secondary | ICD-10-CM | POA: Diagnosis not present

## 2019-09-12 DIAGNOSIS — G4733 Obstructive sleep apnea (adult) (pediatric): Secondary | ICD-10-CM | POA: Diagnosis not present

## 2019-09-19 DIAGNOSIS — C250 Malignant neoplasm of head of pancreas: Secondary | ICD-10-CM | POA: Diagnosis not present

## 2019-09-19 DIAGNOSIS — F419 Anxiety disorder, unspecified: Secondary | ICD-10-CM | POA: Diagnosis not present

## 2019-09-19 DIAGNOSIS — I10 Essential (primary) hypertension: Secondary | ICD-10-CM | POA: Diagnosis not present

## 2019-09-19 DIAGNOSIS — J449 Chronic obstructive pulmonary disease, unspecified: Secondary | ICD-10-CM | POA: Diagnosis not present

## 2019-09-19 DIAGNOSIS — Z87891 Personal history of nicotine dependence: Secondary | ICD-10-CM | POA: Diagnosis not present

## 2019-09-19 DIAGNOSIS — G4733 Obstructive sleep apnea (adult) (pediatric): Secondary | ICD-10-CM | POA: Diagnosis not present

## 2019-09-20 DIAGNOSIS — J449 Chronic obstructive pulmonary disease, unspecified: Secondary | ICD-10-CM | POA: Diagnosis not present

## 2019-09-20 DIAGNOSIS — G4733 Obstructive sleep apnea (adult) (pediatric): Secondary | ICD-10-CM | POA: Diagnosis not present

## 2019-09-20 DIAGNOSIS — Z87891 Personal history of nicotine dependence: Secondary | ICD-10-CM | POA: Diagnosis not present

## 2019-09-20 DIAGNOSIS — I10 Essential (primary) hypertension: Secondary | ICD-10-CM | POA: Diagnosis not present

## 2019-09-20 DIAGNOSIS — C250 Malignant neoplasm of head of pancreas: Secondary | ICD-10-CM | POA: Diagnosis not present

## 2019-09-20 DIAGNOSIS — F419 Anxiety disorder, unspecified: Secondary | ICD-10-CM | POA: Diagnosis not present

## 2019-09-26 DIAGNOSIS — I10 Essential (primary) hypertension: Secondary | ICD-10-CM | POA: Diagnosis not present

## 2019-09-26 DIAGNOSIS — Z87891 Personal history of nicotine dependence: Secondary | ICD-10-CM | POA: Diagnosis not present

## 2019-09-26 DIAGNOSIS — C250 Malignant neoplasm of head of pancreas: Secondary | ICD-10-CM | POA: Diagnosis not present

## 2019-09-26 DIAGNOSIS — F419 Anxiety disorder, unspecified: Secondary | ICD-10-CM | POA: Diagnosis not present

## 2019-09-26 DIAGNOSIS — G4733 Obstructive sleep apnea (adult) (pediatric): Secondary | ICD-10-CM | POA: Diagnosis not present

## 2019-09-26 DIAGNOSIS — J449 Chronic obstructive pulmonary disease, unspecified: Secondary | ICD-10-CM | POA: Diagnosis not present

## 2019-09-28 DIAGNOSIS — F419 Anxiety disorder, unspecified: Secondary | ICD-10-CM | POA: Diagnosis not present

## 2019-09-28 DIAGNOSIS — Z87891 Personal history of nicotine dependence: Secondary | ICD-10-CM | POA: Diagnosis not present

## 2019-09-28 DIAGNOSIS — C250 Malignant neoplasm of head of pancreas: Secondary | ICD-10-CM | POA: Diagnosis not present

## 2019-09-28 DIAGNOSIS — I10 Essential (primary) hypertension: Secondary | ICD-10-CM | POA: Diagnosis not present

## 2019-09-28 DIAGNOSIS — G4733 Obstructive sleep apnea (adult) (pediatric): Secondary | ICD-10-CM | POA: Diagnosis not present

## 2019-09-28 DIAGNOSIS — J449 Chronic obstructive pulmonary disease, unspecified: Secondary | ICD-10-CM | POA: Diagnosis not present

## 2019-09-29 DIAGNOSIS — F419 Anxiety disorder, unspecified: Secondary | ICD-10-CM | POA: Diagnosis not present

## 2019-09-29 DIAGNOSIS — J449 Chronic obstructive pulmonary disease, unspecified: Secondary | ICD-10-CM | POA: Diagnosis not present

## 2019-09-29 DIAGNOSIS — I10 Essential (primary) hypertension: Secondary | ICD-10-CM | POA: Diagnosis not present

## 2019-09-29 DIAGNOSIS — C250 Malignant neoplasm of head of pancreas: Secondary | ICD-10-CM | POA: Diagnosis not present

## 2019-09-29 DIAGNOSIS — Z87891 Personal history of nicotine dependence: Secondary | ICD-10-CM | POA: Diagnosis not present

## 2019-09-29 DIAGNOSIS — G4733 Obstructive sleep apnea (adult) (pediatric): Secondary | ICD-10-CM | POA: Diagnosis not present

## 2019-09-30 DIAGNOSIS — K219 Gastro-esophageal reflux disease without esophagitis: Secondary | ICD-10-CM | POA: Diagnosis not present

## 2019-09-30 DIAGNOSIS — E785 Hyperlipidemia, unspecified: Secondary | ICD-10-CM | POA: Diagnosis not present

## 2019-09-30 DIAGNOSIS — C250 Malignant neoplasm of head of pancreas: Secondary | ICD-10-CM | POA: Diagnosis not present

## 2019-09-30 DIAGNOSIS — G4733 Obstructive sleep apnea (adult) (pediatric): Secondary | ICD-10-CM | POA: Diagnosis not present

## 2019-09-30 DIAGNOSIS — I10 Essential (primary) hypertension: Secondary | ICD-10-CM | POA: Diagnosis not present

## 2019-09-30 DIAGNOSIS — F419 Anxiety disorder, unspecified: Secondary | ICD-10-CM | POA: Diagnosis not present

## 2019-09-30 DIAGNOSIS — Z87891 Personal history of nicotine dependence: Secondary | ICD-10-CM | POA: Diagnosis not present

## 2019-09-30 DIAGNOSIS — J449 Chronic obstructive pulmonary disease, unspecified: Secondary | ICD-10-CM | POA: Diagnosis not present

## 2019-10-03 DIAGNOSIS — I10 Essential (primary) hypertension: Secondary | ICD-10-CM | POA: Diagnosis not present

## 2019-10-03 DIAGNOSIS — G4733 Obstructive sleep apnea (adult) (pediatric): Secondary | ICD-10-CM | POA: Diagnosis not present

## 2019-10-03 DIAGNOSIS — C250 Malignant neoplasm of head of pancreas: Secondary | ICD-10-CM | POA: Diagnosis not present

## 2019-10-03 DIAGNOSIS — J449 Chronic obstructive pulmonary disease, unspecified: Secondary | ICD-10-CM | POA: Diagnosis not present

## 2019-10-03 DIAGNOSIS — F419 Anxiety disorder, unspecified: Secondary | ICD-10-CM | POA: Diagnosis not present

## 2019-10-03 DIAGNOSIS — Z87891 Personal history of nicotine dependence: Secondary | ICD-10-CM | POA: Diagnosis not present

## 2019-10-05 DIAGNOSIS — G4733 Obstructive sleep apnea (adult) (pediatric): Secondary | ICD-10-CM | POA: Diagnosis not present

## 2019-10-05 DIAGNOSIS — J449 Chronic obstructive pulmonary disease, unspecified: Secondary | ICD-10-CM | POA: Diagnosis not present

## 2019-10-05 DIAGNOSIS — F419 Anxiety disorder, unspecified: Secondary | ICD-10-CM | POA: Diagnosis not present

## 2019-10-05 DIAGNOSIS — I10 Essential (primary) hypertension: Secondary | ICD-10-CM | POA: Diagnosis not present

## 2019-10-05 DIAGNOSIS — C250 Malignant neoplasm of head of pancreas: Secondary | ICD-10-CM | POA: Diagnosis not present

## 2019-10-05 DIAGNOSIS — Z87891 Personal history of nicotine dependence: Secondary | ICD-10-CM | POA: Diagnosis not present

## 2019-10-09 DIAGNOSIS — Z23 Encounter for immunization: Secondary | ICD-10-CM | POA: Diagnosis not present

## 2019-10-10 DIAGNOSIS — C250 Malignant neoplasm of head of pancreas: Secondary | ICD-10-CM | POA: Diagnosis not present

## 2019-10-10 DIAGNOSIS — G4733 Obstructive sleep apnea (adult) (pediatric): Secondary | ICD-10-CM | POA: Diagnosis not present

## 2019-10-10 DIAGNOSIS — I10 Essential (primary) hypertension: Secondary | ICD-10-CM | POA: Diagnosis not present

## 2019-10-10 DIAGNOSIS — Z87891 Personal history of nicotine dependence: Secondary | ICD-10-CM | POA: Diagnosis not present

## 2019-10-10 DIAGNOSIS — J449 Chronic obstructive pulmonary disease, unspecified: Secondary | ICD-10-CM | POA: Diagnosis not present

## 2019-10-10 DIAGNOSIS — F419 Anxiety disorder, unspecified: Secondary | ICD-10-CM | POA: Diagnosis not present

## 2019-10-11 DIAGNOSIS — J449 Chronic obstructive pulmonary disease, unspecified: Secondary | ICD-10-CM | POA: Diagnosis not present

## 2019-10-11 DIAGNOSIS — F419 Anxiety disorder, unspecified: Secondary | ICD-10-CM | POA: Diagnosis not present

## 2019-10-11 DIAGNOSIS — G4733 Obstructive sleep apnea (adult) (pediatric): Secondary | ICD-10-CM | POA: Diagnosis not present

## 2019-10-11 DIAGNOSIS — I10 Essential (primary) hypertension: Secondary | ICD-10-CM | POA: Diagnosis not present

## 2019-10-11 DIAGNOSIS — Z87891 Personal history of nicotine dependence: Secondary | ICD-10-CM | POA: Diagnosis not present

## 2019-10-11 DIAGNOSIS — C250 Malignant neoplasm of head of pancreas: Secondary | ICD-10-CM | POA: Diagnosis not present

## 2019-10-13 DIAGNOSIS — I10 Essential (primary) hypertension: Secondary | ICD-10-CM | POA: Diagnosis not present

## 2019-10-13 DIAGNOSIS — F419 Anxiety disorder, unspecified: Secondary | ICD-10-CM | POA: Diagnosis not present

## 2019-10-13 DIAGNOSIS — J449 Chronic obstructive pulmonary disease, unspecified: Secondary | ICD-10-CM | POA: Diagnosis not present

## 2019-10-13 DIAGNOSIS — Z87891 Personal history of nicotine dependence: Secondary | ICD-10-CM | POA: Diagnosis not present

## 2019-10-13 DIAGNOSIS — G4733 Obstructive sleep apnea (adult) (pediatric): Secondary | ICD-10-CM | POA: Diagnosis not present

## 2019-10-13 DIAGNOSIS — C250 Malignant neoplasm of head of pancreas: Secondary | ICD-10-CM | POA: Diagnosis not present

## 2019-10-24 DIAGNOSIS — I10 Essential (primary) hypertension: Secondary | ICD-10-CM | POA: Diagnosis not present

## 2019-10-24 DIAGNOSIS — Z87891 Personal history of nicotine dependence: Secondary | ICD-10-CM | POA: Diagnosis not present

## 2019-10-24 DIAGNOSIS — C250 Malignant neoplasm of head of pancreas: Secondary | ICD-10-CM | POA: Diagnosis not present

## 2019-10-24 DIAGNOSIS — J449 Chronic obstructive pulmonary disease, unspecified: Secondary | ICD-10-CM | POA: Diagnosis not present

## 2019-10-24 DIAGNOSIS — F419 Anxiety disorder, unspecified: Secondary | ICD-10-CM | POA: Diagnosis not present

## 2019-10-24 DIAGNOSIS — G4733 Obstructive sleep apnea (adult) (pediatric): Secondary | ICD-10-CM | POA: Diagnosis not present

## 2020-10-29 DEATH — deceased
